# Patient Record
Sex: Female | Born: 1965 | Race: White | Hispanic: No | Marital: Married | Smoking: Never smoker
Health system: Southern US, Community
[De-identification: ages and names within clinical notes are randomized; demographics above are authoritative.]

## PROBLEM LIST (undated history)

## (undated) DIAGNOSIS — E785 Hyperlipidemia, unspecified: Secondary | ICD-10-CM

## (undated) DIAGNOSIS — K219 Gastro-esophageal reflux disease without esophagitis: Secondary | ICD-10-CM

## (undated) HISTORY — DX: Gastro-esophageal reflux disease without esophagitis: K21.9

## (undated) HISTORY — DX: Hyperlipidemia, unspecified: E78.5

## (undated) HISTORY — PX: OTHER SURGICAL HISTORY: SHX169

---

## 1998-05-14 ENCOUNTER — Other Ambulatory Visit: Admission: RE | Admit: 1998-05-14 | Discharge: 1998-05-14 | Payer: Self-pay | Admitting: Obstetrics and Gynecology

## 1999-06-13 ENCOUNTER — Other Ambulatory Visit: Admission: RE | Admit: 1999-06-13 | Discharge: 1999-06-13 | Payer: Self-pay | Admitting: Obstetrics and Gynecology

## 2000-06-22 ENCOUNTER — Encounter: Payer: Self-pay | Admitting: Obstetrics and Gynecology

## 2000-06-22 ENCOUNTER — Ambulatory Visit (HOSPITAL_COMMUNITY): Admission: RE | Admit: 2000-06-22 | Discharge: 2000-06-22 | Payer: Self-pay | Admitting: Obstetrics and Gynecology

## 2001-11-08 ENCOUNTER — Other Ambulatory Visit: Admission: RE | Admit: 2001-11-08 | Discharge: 2001-11-08 | Payer: Self-pay | Admitting: Obstetrics and Gynecology

## 2002-11-14 ENCOUNTER — Other Ambulatory Visit: Admission: RE | Admit: 2002-11-14 | Discharge: 2002-11-14 | Payer: Self-pay | Admitting: Obstetrics and Gynecology

## 2003-03-07 ENCOUNTER — Encounter: Payer: Self-pay | Admitting: Obstetrics and Gynecology

## 2003-03-07 ENCOUNTER — Ambulatory Visit (HOSPITAL_COMMUNITY): Admission: RE | Admit: 2003-03-07 | Discharge: 2003-03-07 | Payer: Self-pay | Admitting: Obstetrics and Gynecology

## 2003-12-21 ENCOUNTER — Other Ambulatory Visit: Admission: RE | Admit: 2003-12-21 | Discharge: 2003-12-21 | Payer: Self-pay | Admitting: Obstetrics and Gynecology

## 2004-07-12 HISTORY — PX: ESOPHAGOGASTRODUODENOSCOPY: SHX1529

## 2004-07-21 ENCOUNTER — Ambulatory Visit (HOSPITAL_COMMUNITY): Admission: RE | Admit: 2004-07-21 | Discharge: 2004-07-21 | Payer: Self-pay | Admitting: Gastroenterology

## 2004-09-11 ENCOUNTER — Encounter: Admission: RE | Admit: 2004-09-11 | Discharge: 2004-09-11 | Payer: Self-pay | Admitting: Gastroenterology

## 2005-02-04 ENCOUNTER — Other Ambulatory Visit: Admission: RE | Admit: 2005-02-04 | Discharge: 2005-02-04 | Payer: Self-pay | Admitting: Obstetrics and Gynecology

## 2006-06-25 ENCOUNTER — Ambulatory Visit: Payer: Self-pay | Admitting: Family Medicine

## 2006-10-20 ENCOUNTER — Ambulatory Visit: Payer: Self-pay | Admitting: Family Medicine

## 2007-05-11 ENCOUNTER — Ambulatory Visit: Payer: Self-pay | Admitting: Family Medicine

## 2008-08-14 ENCOUNTER — Ambulatory Visit: Payer: Self-pay | Admitting: Family Medicine

## 2009-05-31 ENCOUNTER — Ambulatory Visit: Payer: Self-pay | Admitting: Family Medicine

## 2009-11-21 ENCOUNTER — Ambulatory Visit: Payer: Self-pay | Admitting: Family Medicine

## 2010-11-11 ENCOUNTER — Ambulatory Visit: Admit: 2010-11-11 | Payer: Self-pay | Admitting: Family Medicine

## 2011-01-02 ENCOUNTER — Encounter: Payer: Self-pay | Admitting: Family Medicine

## 2011-01-20 ENCOUNTER — Encounter (INDEPENDENT_AMBULATORY_CARE_PROVIDER_SITE_OTHER): Payer: 59 | Admitting: Family Medicine

## 2011-01-20 DIAGNOSIS — K219 Gastro-esophageal reflux disease without esophagitis: Secondary | ICD-10-CM

## 2011-01-20 DIAGNOSIS — J309 Allergic rhinitis, unspecified: Secondary | ICD-10-CM

## 2011-01-20 DIAGNOSIS — Z0000 Encounter for general adult medical examination without abnormal findings: Secondary | ICD-10-CM

## 2011-01-20 DIAGNOSIS — E785 Hyperlipidemia, unspecified: Secondary | ICD-10-CM

## 2011-01-20 DIAGNOSIS — Z79899 Other long term (current) drug therapy: Secondary | ICD-10-CM

## 2011-02-27 NOTE — Op Note (Signed)
Lynn Fernandez, Lynn Fernandez                ACCOUNT NO.:  [id number]   MEDICAL RECORD NO.:  [id number]          PATIENT TYPE:  AMB   LOCATION:  ENDO                         FACILITY:  [facility]   PHYSICIAN:  John C. Madilyn Fireman, M.D.    DATE OF BIRTH:  December 18, 1965   DATE OF PROCEDURE:  07/21/2004  DATE OF DISCHARGE:                                 OPERATIVE REPORT   PROCEDURE PERFORMED:  Esophagogastroduodenoscopy.   ENDOSCOPIST:  Barrie Folk, M.D.   INDICATIONS FOR PROCEDURE:  Chronic gastroesophageal reflux symptoms with  some breakthrough symptoms despite proton pump inhibitor.  Procedure is to  assess for any unhealed esophagitis or Barrett's esophagus,  and to guide  therapy regarding advisability of possible antireflux surgery.   DESCRIPTION OF PROCEDURE:  The patient was placed in the left lateral  decubitus position and placed on the pulse monitor with continuous low-flow  oxygen delivered by nasal cannula.  She was sedated with 50 mcg of IV  fentanyl, 5 mg of IV Versed.  The Olympus video endoscope was advanced under  direct vision into the oropharynx and the esophagus.  The esophagus was  straight and of normal caliber with the squamocolumnar line at 38  centimeters.  There was no visible esophagitis, ring, stricture or other  abnormality of the gastroesophageal junction.  The stomach was entered and a  small amount of liquid secretions was suctioned from the fundus.  A  retroflex view of the cardia was unremarkable.  The fundus, body, antrum and  pylorus all appeared normal.  The duodenum was entered and both the bulb and  second portion were well inspected and appeared to be within normal limits.  The scope was then withdrawn and the patient was returned to the recovery  room in stable condition.  The patient tolerated the procedure well.  There  were no immediate complications.   IMPRESSION:  Normal endoscopy.   PLAN:  Will continue medical therapy and consider increasing her proton  pump  inhibitor to twice daily dosing.      JCH/MEDQ  D:  07/21/2004  T:  07/21/2004  Job:  [id number]   cc:   Sharlot Gowda, M.D.  [address]  [place], [place] [address]  Fax: [phone]

## 2011-12-11 LAB — HM MAMMOGRAPHY: HM Mammogram: NORMAL

## 2011-12-11 LAB — HM PAP SMEAR: HM Pap smear: NORMAL

## 2012-01-19 ENCOUNTER — Ambulatory Visit: Payer: 59 | Admitting: Medical

## 2012-01-19 ENCOUNTER — Encounter: Payer: Self-pay | Admitting: Internal Medicine

## 2012-02-17 ENCOUNTER — Telehealth: Payer: Self-pay | Admitting: Family Medicine

## 2012-02-17 NOTE — Telephone Encounter (Signed)
Fax req rcd for Lipitor 20 mg  #30  1 po qd     [facility]

## 2012-02-18 ENCOUNTER — Other Ambulatory Visit: Payer: Self-pay

## 2012-02-18 NOTE — Telephone Encounter (Signed)
Denied pt hasnt been here in 5years

## 2012-02-29 ENCOUNTER — Telehealth: Payer: Self-pay | Admitting: Family Medicine

## 2012-02-29 MED ORDER — ATORVASTATIN CALCIUM 20 MG PO TABS: 20.0000 mg | ORAL_TABLET | Freq: Every day | ORAL | Status: DC

## 2012-02-29 NOTE — Telephone Encounter (Signed)
PT HAS A CPE NEXT MONTH

## 2012-02-29 NOTE — Telephone Encounter (Signed)
This is Lynn Fernandez. She has had a legal name change. She has a cpe scheduled in a month but needs lipitor until then. Pt uses [facility] in [place].

## 2012-03-31 ENCOUNTER — Encounter: Payer: Self-pay | Admitting: Family Medicine

## 2012-03-31 ENCOUNTER — Ambulatory Visit (INDEPENDENT_AMBULATORY_CARE_PROVIDER_SITE_OTHER): Payer: 59 | Admitting: Family Medicine

## 2012-03-31 VITALS — BP 120/80 | HR 74 | Ht 63.0 in | Wt 133.0 lb

## 2012-03-31 DIAGNOSIS — E785 Hyperlipidemia, unspecified: Secondary | ICD-10-CM | POA: Insufficient documentation

## 2012-03-31 DIAGNOSIS — J302 Other seasonal allergic rhinitis: Secondary | ICD-10-CM

## 2012-03-31 DIAGNOSIS — Z79899 Other long term (current) drug therapy: Secondary | ICD-10-CM

## 2012-03-31 DIAGNOSIS — J309 Allergic rhinitis, unspecified: Secondary | ICD-10-CM

## 2012-03-31 DIAGNOSIS — Z0000 Encounter for general adult medical examination without abnormal findings: Secondary | ICD-10-CM

## 2012-03-31 DIAGNOSIS — K219 Gastro-esophageal reflux disease without esophagitis: Secondary | ICD-10-CM

## 2012-03-31 LAB — COMPREHENSIVE METABOLIC PANEL
AST: 23 U/L (ref 0–37)
Albumin: 4 g/dL (ref 3.5–5.2)
BUN: 11 mg/dL (ref 6–23)
CO2: 25 mEq/L (ref 19–32)
Calcium: 9.6 mg/dL (ref 8.4–10.5)
Chloride: 105 mEq/L (ref 96–112)
Glucose, Bld: 79 mg/dL (ref 70–99)
Potassium: 4.2 mEq/L (ref 3.5–5.3)

## 2012-03-31 LAB — LIPID PANEL
Cholesterol: 205 mg/dL — ABNORMAL HIGH (ref 0–200)
HDL: 84 mg/dL (ref >39)
LDL Cholesterol: 83 mg/dL (ref 0–99)
Total CHOL/HDL Ratio: 2.4 Ratio
Triglycerides: 190 mg/dL — ABNORMAL HIGH (ref <150)
VLDL: 38 mg/dL (ref 0–40)

## 2012-03-31 NOTE — Progress Notes (Signed)
  Subjective:    Patient ID: Lynn Fernandez, female    DOB: 03-26-1966, 46 y.o.   MRN: [id number]  HPI She is here for a complete examination. She is followed by her gynecologist. She has had mammogram Pap smear. She is up-to-date on her immunizations. She continues on Lipitor. She does have reflux but is having no difficulties with this. Her allergies are also under good control. She was recently married and this is going quite well. They were living together prior to this. Social and family history were reviewed.   Review of Systems  Constitutional: Negative.   HENT: Negative.   Eyes: Negative.   Respiratory: Negative.   Cardiovascular: Negative.   Gastrointestinal: Negative.   Genitourinary: Negative.   Musculoskeletal: Negative.   Neurological: Negative.   Hematological: Negative.   Psychiatric/Behavioral: Negative.        Objective:   Physical Exam BP 120/80  Pulse 74  Ht 5\' 3"  (1.6 m)  Wt 133 lb (60.328 kg)  BMI 23.56 kg/m2  SpO2 95%  General Appearance:    Alert, cooperative, no distress, appears stated age  Head:    Normocephalic, without obvious abnormality, atraumatic  Eyes:    PERRL, conjunctiva/corneas clear, EOM's intact, fundi    benign  Ears:    Normal TM's and external ear canals  Nose:   Nares normal, mucosa normal, no drainage or sinus   tenderness  Throat:   Lips, mucosa, and tongue normal; teeth and gums normal  Neck:   Supple, no lymphadenopathy;  thyroid:  no   enlargement/tenderness/nodules; no carotid   bruit or JVD  Back:    Spine nontender, no curvature, ROM normal, no CVA     tenderness  Lungs:     Clear to auscultation bilaterally without wheezes, rales or     ronchi; respirations unlabored  Chest Wall:    No tenderness or deformity   Heart:    Regular rate and rhythm, S1 and S2 normal, no murmur, rub   or gallop  Breast Exam:    Deferred to GYN  Abdomen:     Soft, non-tender, nondistended, normoactive bowel sounds,    no masses, no  hepatosplenomegaly  Genitalia:    Deferred to GYN     Extremities:   No clubbing, cyanosis or edema  Pulses:   2+ and symmetric all extremities  Skin:   Skin color, texture, turgor normal, no rashes or lesions  Lymph nodes:   Cervical, supraclavicular, and axillary nodes normal  Neurologic:   CNII-XII intact, normal strength, sensation and gait; reflexes 2+ and symmetric throughout          Psych:   Normal mood, affect, hygiene and grooming.          Assessment & Plan:   1. Routine general medical examination at a health care facility  Lipid panel, Comprehensive metabolic panel  2. GERD (gastroesophageal reflux disease)    3. Allergic rhinitis, seasonal    4. Hyperlipidemia LDL goal < 100  Lipid panel, Comprehensive metabolic panel  5. Encounter for long-term (current) use of other medications  Lipid panel, Comprehensive metabolic panel   I encouraged her to continue to take good care of herself.

## 2012-04-01 ENCOUNTER — Telehealth: Payer: Self-pay | Admitting: Internal Medicine

## 2012-04-01 MED ORDER — ATORVASTATIN CALCIUM 20 MG PO TABS: 20.0000 mg | ORAL_TABLET | Freq: Every day | ORAL | Status: DC

## 2012-04-01 NOTE — Telephone Encounter (Signed)
done

## 2012-06-25 ENCOUNTER — Other Ambulatory Visit: Payer: Self-pay | Admitting: Family Medicine

## 2012-08-22 ENCOUNTER — Other Ambulatory Visit: Payer: Self-pay | Admitting: Family Medicine

## 2012-12-01 ENCOUNTER — Other Ambulatory Visit: Payer: Self-pay | Admitting: Family Medicine

## 2013-05-01 ENCOUNTER — Other Ambulatory Visit: Payer: Self-pay | Admitting: Family Medicine

## 2013-07-04 ENCOUNTER — Other Ambulatory Visit: Payer: Self-pay | Admitting: Family Medicine

## 2015-01-02 ENCOUNTER — Encounter: Payer: Self-pay | Admitting: Family Medicine

## 2015-01-10 ENCOUNTER — Ambulatory Visit (INDEPENDENT_AMBULATORY_CARE_PROVIDER_SITE_OTHER): Payer: BLUE CROSS/BLUE SHIELD | Admitting: Family Medicine

## 2015-01-10 ENCOUNTER — Encounter: Payer: Self-pay | Admitting: Family Medicine

## 2015-01-10 VITALS — BP 120/78 | HR 64 | Ht 63.5 in | Wt 132.0 lb

## 2015-01-10 DIAGNOSIS — E785 Hyperlipidemia, unspecified: Secondary | ICD-10-CM | POA: Diagnosis not present

## 2015-01-10 DIAGNOSIS — K219 Gastro-esophageal reflux disease without esophagitis: Secondary | ICD-10-CM

## 2015-01-10 DIAGNOSIS — J302 Other seasonal allergic rhinitis: Secondary | ICD-10-CM | POA: Diagnosis not present

## 2015-01-10 DIAGNOSIS — Z0000 Encounter for general adult medical examination without abnormal findings: Secondary | ICD-10-CM | POA: Diagnosis not present

## 2015-01-10 LAB — CBC WITH DIFFERENTIAL/PLATELET
BASOS ABS: 0.1 10*3/uL (ref 0.0–0.1)
BASOS PCT: 1 % (ref 0–1)
EOS ABS: 0.2 10*3/uL (ref 0.0–0.7)
Eosinophils Relative: 3 % (ref 0–5)
HEMATOCRIT: 39.5 % (ref 36.0–46.0)
Hemoglobin: 13.4 g/dL (ref 12.0–15.0)
Lymphocytes Relative: 36 % (ref 12–46)
Lymphs Abs: 2.4 10*3/uL (ref 0.7–4.0)
MCH: 30.5 pg (ref 26.0–34.0)
MCHC: 33.9 g/dL (ref 30.0–36.0)
MCV: 90 fL (ref 78.0–100.0)
MONO ABS: 0.5 10*3/uL (ref 0.1–1.0)
MONOS PCT: 8 % (ref 3–12)
MPV: 10.4 fL (ref 8.6–12.4)
Neutro Abs: 3.5 10*3/uL (ref 1.7–7.7)
Neutrophils Relative %: 52 % (ref 43–77)
PLATELETS: 302 10*3/uL (ref 150–400)
RBC: 4.39 MIL/uL (ref 3.87–5.11)
RDW: 12.4 % (ref 11.5–15.5)
WBC: 6.8 10*3/uL (ref 4.0–10.5)

## 2015-01-10 LAB — COMPREHENSIVE METABOLIC PANEL
ALT: 23 U/L (ref 0–35)
AST: 21 U/L (ref 0–37)
Albumin: 4.4 g/dL (ref 3.5–5.2)
Alkaline Phosphatase: 15 U/L — ABNORMAL LOW (ref 39–117)
BUN: 12 mg/dL (ref 6–23)
CALCIUM: 9.5 mg/dL (ref 8.4–10.5)
CHLORIDE: 100 meq/L (ref 96–112)
CO2: 24 mEq/L (ref 19–32)
Creat: 0.71 mg/dL (ref 0.50–1.10)
GLUCOSE: 78 mg/dL (ref 70–99)
POTASSIUM: 3.8 meq/L (ref 3.5–5.3)
SODIUM: 135 meq/L (ref 135–145)
TOTAL PROTEIN: 7.2 g/dL (ref 6.0–8.3)
Total Bilirubin: 0.9 mg/dL (ref 0.2–1.2)

## 2015-01-10 LAB — LIPID PANEL
CHOL/HDL RATIO: 3.2 ratio
CHOLESTEROL: 266 mg/dL — AB (ref 0–200)
HDL: 84 mg/dL (ref >=46)
LDL CALC: 152 mg/dL — AB (ref 0–99)
Triglycerides: 152 mg/dL — ABNORMAL HIGH (ref <150)
VLDL: 30 mg/dL (ref 0–40)

## 2015-01-10 NOTE — Progress Notes (Signed)
Subjective:    Patient ID: Lynn Fernandez, female    DOB: 1966-07-25, 49 y.o.   MRN: [id number]  HPI She is here for an annual exam. She complains of an intermittent 3-4 month history of brief episodes of elongated sighs or pauses in respiration follow by a deeper, longer inspiration. These episodes occur once or twice every other day but in the past week it has been occuring less frequently. She reports a fleeting tightness in her chest that occurs along with the abnormal respiration. Denies chest pain, palpitations, cough, DOE, PND. She also denies fever, chills, unintentional weight loss, fatigue, changes in bowel or bladder habits.   She states she has seasonal allergies and takes Zyrtec as needed. Her allergies are usually worse in the spring time.  GERD is well managed by avoiding offensive foods and taking Nexium daily. She has a history of hyperlipidemia and states she chose to stop taking Lipitor in 2013 and has been eating a healthy diet. She does not exercise but is active by taking care of her animals and riding her horse.   She is married and has a good home life. Reports stressful work life but manages by talking with co-workers and friends.  She gets pap smears and pelvic exams at her Gynecologist office and mammograms as recommended. She is currently sexually active, having regular menstrual cycles and uses an OCP.   Regular eye exams, dental exams, wears her seatbelt and has smoke detectors in her home.  Allergies, Medications, family and social history reviewed. Health maintenance and immunizations also reviewed.   Review of Systems  All other systems reviewed and are negative.      Objective:   Physical Exam  BP 120/78 mmHg  Pulse 64  Ht 5' 3.5" (1.613 m)  Wt 132 lb (59.875 kg)  BMI 23.01 kg/m2  General Appearance:    Alert, cooperative, no distress, appears stated age  Head:    Normocephalic, without obvious abnormality, atraumatic  Eyes:    PERRL,  conjunctiva/corneas clear, EOM's intact, fundi    benign, both eyes  Ears:    Normal TM's and external ear canals, both ears  Nose:   Nares slightly erythematous, septum midline, mucosa normal, no drainage    or sinus tenderness  Throat:   Lips, mucosa, and tongue normal; teeth and gums normal  Neck:   Supple, symmetrical, trachea midline, no adenopathy;    thyroid:  no enlargement/tenderness/nodules  Back:     Symmetric, no curvature, ROM normal, no CVA tenderness  Lungs:     Clear to auscultation bilaterally, respirations unlabored  Chest Wall:    No tenderness or deformity   Heart:    Regular rate and rhythm, S1 and S2 normal, no murmur, rub   or gallop  Breast Exam:    Deferred to OB/GYN  Abdomen:     Soft, non-tender, bowel sounds active all four quadrants,    no masses, no organomegaly  Genitalia:    Deferred to OB/GYN  Rectal:    Deferred   Extremities:   Extremities normal, atraumatic, no cyanosis or edema  Pulses:   2+ and symmetric all extremities  Skin:   Skin color, texture, turgor normal, no rashes or lesions  Lymph nodes:   Cervical, supraclavicular, and axillary nodes normal  Neurologic:   CNII-XII intact, normal strength, sensation and reflexes    throughout         Assessment & Plan:  Routine general medical examination at a health care  facility  Allergic rhinitis, seasonal  Gastroesophageal reflux disease without esophagitis  Hyperlipidemia with target LDL less than 100  Discussed that her intermittent irregular respirations are non diagnostic and that it does not appear to be cardiac related. She will let me know if this worsens or becomes more frequent. Discussed her continuing to take good care of herself. Follow up pending lab results.

## 2016-01-22 DIAGNOSIS — Z6823 Body mass index (BMI) 23.0-23.9, adult: Secondary | ICD-10-CM | POA: Diagnosis not present

## 2016-01-22 DIAGNOSIS — Z124 Encounter for screening for malignant neoplasm of cervix: Secondary | ICD-10-CM | POA: Diagnosis not present

## 2016-01-22 DIAGNOSIS — Z1231 Encounter for screening mammogram for malignant neoplasm of breast: Secondary | ICD-10-CM | POA: Diagnosis not present

## 2016-01-22 DIAGNOSIS — Z01419 Encounter for gynecological examination (general) (routine) without abnormal findings: Secondary | ICD-10-CM | POA: Diagnosis not present

## 2016-01-27 ENCOUNTER — Other Ambulatory Visit: Payer: Self-pay | Admitting: Obstetrics and Gynecology

## 2016-01-27 DIAGNOSIS — R928 Other abnormal and inconclusive findings on diagnostic imaging of breast: Secondary | ICD-10-CM

## 2016-03-06 ENCOUNTER — Ambulatory Visit
Admission: RE | Admit: 2016-03-06 | Discharge: 2016-03-06 | Disposition: A | Discharge status: Home / Self Care | Payer: BLUE CROSS/BLUE SHIELD | Source: Ambulatory Visit | Attending: Obstetrics and Gynecology | Admitting: Obstetrics and Gynecology

## 2016-03-06 DIAGNOSIS — R928 Other abnormal and inconclusive findings on diagnostic imaging of breast: Secondary | ICD-10-CM

## 2016-06-09 DIAGNOSIS — H43811 Vitreous degeneration, right eye: Secondary | ICD-10-CM | POA: Diagnosis not present

## 2016-06-09 DIAGNOSIS — H4311 Vitreous hemorrhage, right eye: Secondary | ICD-10-CM | POA: Diagnosis not present

## 2016-06-09 DIAGNOSIS — H33311 Horseshoe tear of retina without detachment, right eye: Secondary | ICD-10-CM | POA: Diagnosis not present

## 2016-06-19 DIAGNOSIS — H33311 Horseshoe tear of retina without detachment, right eye: Secondary | ICD-10-CM | POA: Diagnosis not present

## 2016-11-05 ENCOUNTER — Ambulatory Visit (INDEPENDENT_AMBULATORY_CARE_PROVIDER_SITE_OTHER): Payer: BLUE CROSS/BLUE SHIELD | Admitting: Family Medicine

## 2016-11-05 ENCOUNTER — Encounter: Payer: Self-pay | Admitting: Family Medicine

## 2016-11-05 ENCOUNTER — Ambulatory Visit (INDEPENDENT_AMBULATORY_CARE_PROVIDER_SITE_OTHER): Payer: BLUE CROSS/BLUE SHIELD

## 2016-11-05 VITALS — BP 135/89 | HR 93 | Temp 98.6°F | Ht 63.5 in | Wt 137.4 lb

## 2016-11-05 DIAGNOSIS — F411 Generalized anxiety disorder: Secondary | ICD-10-CM | POA: Diagnosis not present

## 2016-11-05 DIAGNOSIS — K2100 Gastro-esophageal reflux disease with esophagitis, without bleeding: Secondary | ICD-10-CM

## 2016-11-05 DIAGNOSIS — E782 Mixed hyperlipidemia: Secondary | ICD-10-CM

## 2016-11-05 DIAGNOSIS — N951 Menopausal and female climacteric states: Secondary | ICD-10-CM

## 2016-11-05 DIAGNOSIS — M25571 Pain in right ankle and joints of right foot: Secondary | ICD-10-CM | POA: Diagnosis not present

## 2016-11-05 MED ORDER — PANTOPRAZOLE SODIUM 40 MG PO TBEC: 40.0000 mg | DELAYED_RELEASE_TABLET | Freq: Every day | ORAL | 11 refills | Status: DC

## 2016-11-05 MED ORDER — DULOXETINE HCL 30 MG PO CPEP: 30.0000 mg | ORAL_CAPSULE | Freq: Every day | ORAL | 0 refills | Status: DC

## 2016-11-05 NOTE — Progress Notes (Signed)
Subjective:  Patient ID: Lynn Fernandez, female    DOB: 01-Mar-1966  Age: 51 y.o. MRN: [id number]  CC: New Patient (Initial Visit) (pt here today c/o joint pain, right ankle and leg pain/swelling, nausea)   HPI Lynn Fernandez presents for multiple sx noted above. NKI right ankle. No bruising. Onset 3 days ago. Moderate pain. Able to ambulate. Hx of anxiety & depression. Elevated stress at work & with family recently. Causing her to feel anxious. Taking multiple vitamiuns & supplements including black cohosh for menopause. Feeling hot flashes frequently.Menses irreg, less frequent Depression screen [facility] 2/9 11/05/2016  Decreased Interest 0  Down, Depressed, Hopeless 0  PHQ - 2 Score 0     History Lynn Fernandez has a past medical history of Allergic rhinitis; Dyslipidemia; GERD (gastroesophageal reflux disease); and Hyperlipidemia.   She has a past surgical history that includes pyloric stenosis and Esophagogastroduodenoscopy (10/05).   Her family history includes Alcohol abuse in her father; Cancer in her father and paternal grandmother; Diabetes in her paternal uncle; Heart disease in her father and maternal grandfather; Hyperlipidemia in her father.She reports that she has never smoked. She has never used smokeless tobacco. She reports that she drinks about 1.8 oz of alcohol per week . She reports that she does not use drugs.  Current Outpatient Prescriptions on File Prior to Visit  Medication Sig Dispense Refill  . drospirenone-ethinyl estradiol (YAZ,GIANVI,LORYNA) 3-0.02 MG tablet Take 1 tablet by mouth daily.     No current facility-administered medications on file prior to visit.     ROS Review of Systems  Constitutional: Negative for activity change, appetite change and fever.  HENT: Negative for congestion, rhinorrhea and sore throat.   Eyes: Negative for visual disturbance.  Respiratory: Negative for cough and shortness of breath.   Cardiovascular: Negative for chest pain  and palpitations.  Gastrointestinal: Negative for abdominal pain, diarrhea and nausea.  Genitourinary: Negative for dysuria.  Musculoskeletal: Negative for arthralgias and myalgias.    Objective:  BP 135/89   Pulse 93   Temp 98.6 F (37 C) (Oral)   Ht 5' 3.5" (1.613 m)   Wt 137 lb 6 oz (62.3 kg)   BMI 23.95 kg/m   Physical Exam  Constitutional: She is oriented to person, place, and time. She appears well-developed and well-nourished. No distress.  HENT:  Head: Normocephalic and atraumatic.  Right Ear: External ear normal.  Left Ear: External ear normal.  Nose: Nose normal.  Mouth/Throat: Oropharynx is clear and moist.  Eyes: Conjunctivae and EOM are normal. Pupils are equal, round, and reactive to light.  Neck: Normal range of motion. Neck supple. No thyromegaly present.  Cardiovascular: Normal rate, regular rhythm and normal heart sounds.   No murmur heard. Pulmonary/Chest: Effort normal and breath sounds normal. No respiratory distress. She has no wheezes. She has no rales.  Abdominal: Soft. Bowel sounds are normal. She exhibits no distension. There is no tenderness.  Musculoskeletal: Normal range of motion. She exhibits edema (trace.) and tenderness (for inversion, at talofibular ligament).  Lymphadenopathy:    She has no cervical adenopathy.  Neurological: She is alert and oriented to person, place, and time. She has normal reflexes.  Skin: Skin is warm and dry.  Psychiatric: Her behavior is normal. Judgment and thought content normal. Her mood appears anxious. Her speech is rapid and/or pressured. Cognition and memory are normal.    Assessment & Plan:   Bijal was seen today for new patient (initial visit).  Diagnoses and all  orders for this visit:  Pain in joint involving right ankle and foot -     DG Ankle Complete Right; Future -     CMP14+EGFR -     TSH  Mixed hyperlipidemia -     CMP14+EGFR -     Lipid panel -     TSH  Gastroesophageal reflux disease  with esophagitis -     CBC with Differential/Platelet -     CMP14+EGFR -     TSH -     pantoprazole (PROTONIX) 40 MG tablet; Take 1 tablet (40 mg total) by mouth daily. For stomach  Peri-menopause -     TSH  Generalized anxiety disorder  Other orders -     DULoxetine (CYMBALTA) 30 MG capsule; Take 1 capsule (30 mg total) by mouth daily. For one week then two daily. Take with a full stomach at suppertime   I have discontinued Ms. Dowe's Esomeprazole Magnesium (NEXIUM PO) and Cetirizine-Pseudoephedrine (ZYRTEC-D PO). I am also having her start on DULoxetine and pantoprazole. Additionally, I am having her maintain her drospirenone-ethinyl estradiol, loratadine-pseudoephedrine, b complex vitamins, Lactobacillus (PROBIOTIC ACIDOPHILUS PO), ASCORBIC ACID PO, co-enzyme Q-10, Vitamin D3, Acetaminophen-Caff-Pyrilamine (MIDOL COMPLETE PO), BLACK COHOSH EXTRACT PO, Magnesium, and Multiple Vitamins-Minerals (MULTIVITAMIN ADULT PO).  Meds ordered this encounter  Medications  . loratadine-pseudoephedrine (CLARITIN-D 24-HOUR) 10-240 MG 24 hr tablet    Sig: Take 1 tablet by mouth daily.  Marland Kitchen b complex vitamins tablet    Sig: Take 1 tablet by mouth daily.  . Lactobacillus (PROBIOTIC ACIDOPHILUS PO)    Sig: Take by mouth.  . ASCORBIC ACID PO    Sig: Take by mouth.  . co-enzyme Q-10 50 MG capsule    Sig: Take 50 mg by mouth daily.  . Cholecalciferol (VITAMIN D3) 1000 units CAPS    Sig: Take by mouth.  . Acetaminophen-Caff-Pyrilamine (MIDOL COMPLETE PO)    Sig: Take by mouth.  Marland Kitchen BLACK COHOSH EXTRACT PO    Sig: Take by mouth.  . Magnesium 250 MG TABS    Sig: Take by mouth.  . Multiple Vitamins-Minerals (MULTIVITAMIN ADULT PO)    Sig: Take by mouth.  . DULoxetine (CYMBALTA) 30 MG capsule    Sig: Take 1 capsule (30 mg total) by mouth daily. For one week then two daily. Take with a full stomach at suppertime    Dispense:  60 capsule    Refill:  0  . pantoprazole (PROTONIX) 40 MG tablet     Sig: Take 1 tablet (40 mg total) by mouth daily. For stomach    Dispense:  30 tablet    Refill:  11     Follow-up: Return in about 1 month (around 12/06/2016) for Wellness.  Claretta Fraise, M.D.

## 2016-11-05 NOTE — Patient Instructions (Signed)
Wear ankle brace for all ambulation for 2 weeks. After that ewan by putting on 2 hours later in the day than the previous day. Back up to previous if pain worsens.

## 2016-11-06 LAB — CMP14+EGFR
ALBUMIN: 4.6 g/dL (ref 3.5–5.5)
ALK PHOS: 16 IU/L — AB (ref 39–117)
ALT: 23 IU/L (ref 0–32)
AST: 24 IU/L (ref 0–40)
Albumin/Globulin Ratio: 1.7 (ref 1.2–2.2)
BUN / CREAT RATIO: 11 (ref 9–23)
BUN: 9 mg/dL (ref 6–24)
Bilirubin Total: 0.7 mg/dL (ref 0.0–1.2)
CO2: 23 mmol/L (ref 18–29)
CREATININE: 0.8 mg/dL (ref 0.57–1.00)
Calcium: 9.7 mg/dL (ref 8.7–10.2)
Chloride: 96 mmol/L (ref 96–106)
GFR calc non Af Amer: 87 mL/min/{1.73_m2} (ref >59)
GFR, EST AFRICAN AMERICAN: 100 mL/min/{1.73_m2} (ref >59)
GLUCOSE: 117 mg/dL — AB (ref 65–99)
Globulin, Total: 2.7 g/dL (ref 1.5–4.5)
Potassium: 3.9 mmol/L (ref 3.5–5.2)
Sodium: 139 mmol/L (ref 134–144)
TOTAL PROTEIN: 7.3 g/dL (ref 6.0–8.5)

## 2016-11-06 LAB — CBC WITH DIFFERENTIAL/PLATELET
BASOS: 0 %
Basophils Absolute: 0 10*3/uL (ref 0.0–0.2)
EOS (ABSOLUTE): 0.1 10*3/uL (ref 0.0–0.4)
EOS: 1 %
HEMATOCRIT: 39.7 % (ref 34.0–46.6)
HEMOGLOBIN: 13.2 g/dL (ref 11.1–15.9)
Immature Grans (Abs): 0 10*3/uL (ref 0.0–0.1)
Immature Granulocytes: 0 %
LYMPHS ABS: 2.1 10*3/uL (ref 0.7–3.1)
Lymphs: 37 %
MCH: 30.9 pg (ref 26.6–33.0)
MCHC: 33.2 g/dL (ref 31.5–35.7)
MCV: 93 fL (ref 79–97)
MONOCYTES: 9 %
MONOS ABS: 0.5 10*3/uL (ref 0.1–0.9)
NEUTROS ABS: 3 10*3/uL (ref 1.4–7.0)
Neutrophils: 53 %
Platelets: 322 10*3/uL (ref 150–379)
RBC: 4.27 x10E6/uL (ref 3.77–5.28)
RDW: 12.8 % (ref 12.3–15.4)
WBC: 5.7 10*3/uL (ref 3.4–10.8)

## 2016-11-06 LAB — LIPID PANEL
Chol/HDL Ratio: 2.7 ratio units (ref 0.0–4.4)
Cholesterol, Total: 247 mg/dL — ABNORMAL HIGH (ref 100–199)
HDL: 90 mg/dL (ref >39)
LDL CALC: 121 mg/dL — AB (ref 0–99)
Triglycerides: 181 mg/dL — ABNORMAL HIGH (ref 0–149)
VLDL CHOLESTEROL CAL: 36 mg/dL (ref 5–40)

## 2016-11-06 LAB — TSH: TSH: 2.43 u[IU]/mL (ref 0.450–4.500)

## 2016-11-12 ENCOUNTER — Telehealth: Payer: Self-pay | Admitting: Family Medicine

## 2016-11-12 NOTE — Telephone Encounter (Signed)
Patient states that the cymblata is causing her to be nauseated, unable to focus and exhausted. Patient states that she had to leave work today due to be exhausted.

## 2016-11-13 NOTE — Telephone Encounter (Signed)
Patient returned phone call stating that she is taking one capsule nightly but is not taking with food.  Informed patient to eat a meal with to see if that will help with symptoms

## 2016-11-13 NOTE — Telephone Encounter (Signed)
Please contact the patient: Is she taking it with a protein rich meal? Taking at night? One or two capsules? WS

## 2016-11-17 DIAGNOSIS — M25571 Pain in right ankle and joints of right foot: Secondary | ICD-10-CM | POA: Diagnosis not present

## 2016-12-02 ENCOUNTER — Other Ambulatory Visit: Payer: Self-pay | Admitting: *Deleted

## 2016-12-02 ENCOUNTER — Ambulatory Visit (INDEPENDENT_AMBULATORY_CARE_PROVIDER_SITE_OTHER): Payer: BLUE CROSS/BLUE SHIELD | Admitting: Family Medicine

## 2016-12-02 VITALS — BP 136/72 | HR 78 | Temp 98.6°F | Ht 63.5 in | Wt 139.0 lb

## 2016-12-02 DIAGNOSIS — K2100 Gastro-esophageal reflux disease with esophagitis, without bleeding: Secondary | ICD-10-CM

## 2016-12-02 DIAGNOSIS — Z0000 Encounter for general adult medical examination without abnormal findings: Secondary | ICD-10-CM | POA: Diagnosis not present

## 2016-12-02 DIAGNOSIS — E782 Mixed hyperlipidemia: Secondary | ICD-10-CM | POA: Diagnosis not present

## 2016-12-02 LAB — CBC WITH DIFFERENTIAL/PLATELET
BASOS: 0 %
Basophils Absolute: 0 10*3/uL (ref 0.0–0.2)
EOS (ABSOLUTE): 0.2 10*3/uL (ref 0.0–0.4)
Eos: 3 %
HEMOGLOBIN: 13.6 g/dL (ref 11.1–15.9)
Hematocrit: 40.8 % (ref 34.0–46.6)
IMMATURE GRANS (ABS): 0 10*3/uL (ref 0.0–0.1)
Immature Granulocytes: 0 %
LYMPHS ABS: 2.2 10*3/uL (ref 0.7–3.1)
LYMPHS: 34 %
MCH: 31.1 pg (ref 26.6–33.0)
MCHC: 33.3 g/dL (ref 31.5–35.7)
MCV: 93 fL (ref 79–97)
Monocytes Absolute: 0.5 10*3/uL (ref 0.1–0.9)
Monocytes: 8 %
NEUTROS ABS: 3.7 10*3/uL (ref 1.4–7.0)
Neutrophils: 55 %
Platelets: 312 10*3/uL (ref 150–379)
RBC: 4.38 x10E6/uL (ref 3.77–5.28)
RDW: 12.5 % (ref 12.3–15.4)
WBC: 6.7 10*3/uL (ref 3.4–10.8)

## 2016-12-02 LAB — CMP14+EGFR
ALBUMIN: 4.4 g/dL (ref 3.5–5.5)
ALT: 23 IU/L (ref 0–32)
AST: 23 IU/L (ref 0–40)
Albumin/Globulin Ratio: 1.6 (ref 1.2–2.2)
Alkaline Phosphatase: 17 IU/L — ABNORMAL LOW (ref 39–117)
BILIRUBIN TOTAL: 0.8 mg/dL (ref 0.0–1.2)
BUN / CREAT RATIO: 13 (ref 9–23)
BUN: 11 mg/dL (ref 6–24)
CALCIUM: 10 mg/dL (ref 8.7–10.2)
CHLORIDE: 99 mmol/L (ref 96–106)
CO2: 21 mmol/L (ref 18–29)
Creatinine, Ser: 0.83 mg/dL (ref 0.57–1.00)
GFR, EST AFRICAN AMERICAN: 96 (ref >59)
GFR, EST NON AFRICAN AMERICAN: 83 (ref >59)
Globulin, Total: 2.8 (ref 1.5–4.5)
Glucose: 85 mg/dL (ref 65–99)
POTASSIUM: 4.9 mmol/L (ref 3.5–5.2)
Sodium: 137 mmol/L (ref 134–144)
Total Protein: 7.2 g/dL (ref 6.0–8.5)

## 2016-12-02 MED ORDER — PANTOPRAZOLE SODIUM 40 MG PO TBEC: 40.0000 mg | DELAYED_RELEASE_TABLET | Freq: Every day | ORAL | 3 refills | Status: DC

## 2016-12-02 MED ORDER — VORTIOXETINE HBR 5 MG PO TABS: 5.0000 mg | ORAL_TABLET | Freq: Every day | ORAL | 2 refills | Status: DC

## 2016-12-02 NOTE — Progress Notes (Signed)
Subjective:  Patient ID: Lynn Fernandez, female    DOB: 1966/08/11  Age: 51 y.o. MRN: [id number]  CC: Annual Exam (pt here today for Wellness exam and still having right ankle pain)   HPI Lynn Fernandez presents for wellness exam. Tried cymbalta. Couldn't tolerate due to nausea & drowsiness. Planning GYN exam with specialist next month. Ankle pain better. Not well. Brace is uncomfortable.   History Iysis has a past medical history of Allergic rhinitis; Dyslipidemia; GERD (gastroesophageal reflux disease); and Hyperlipidemia.   She has a past surgical history that includes pyloric stenosis and Esophagogastroduodenoscopy (10/05).   Her family history includes Alcohol abuse in her father; Cancer in her father and paternal grandmother; Diabetes in her paternal uncle; Heart disease in her father and maternal grandfather; Hyperlipidemia in her father.She reports that she has never smoked. She has never used smokeless tobacco. She reports that she drinks about 1.8 oz of alcohol per week . She reports that she does not use drugs.    ROS Review of Systems  Constitutional: Negative for appetite change, chills, diaphoresis, fatigue, fever and unexpected weight change.  HENT: Negative for congestion, ear pain, hearing loss, postnasal drip, rhinorrhea, sneezing, sore throat and trouble swallowing.   Eyes: Negative for pain.  Respiratory: Negative for cough, chest tightness and shortness of breath.   Cardiovascular: Negative for chest pain and palpitations.  Gastrointestinal: Negative for abdominal pain, constipation, diarrhea, nausea and vomiting.  Endocrine: Negative for cold intolerance, heat intolerance, polydipsia, polyphagia and polyuria.  Genitourinary: Negative for dysuria, frequency and menstrual problem.  Musculoskeletal: Negative for arthralgias and joint swelling.  Skin: Negative for rash.  Allergic/Immunologic: Negative for environmental allergies.  Neurological: Negative  for dizziness, weakness, numbness and headaches.  Psychiatric/Behavioral: Negative for agitation and dysphoric mood.    Objective:  BP 136/72   Pulse 78   Temp 98.6 F (37 C) (Oral)   Ht 5' 3.5" (1.613 m)   Wt 139 lb (63 kg)   BMI 24.24 kg/m   BP Readings from Last 3 Encounters:  12/02/16 136/72  11/05/16 135/89  01/10/15 120/78    Wt Readings from Last 3 Encounters:  12/02/16 139 lb (63 kg)  11/05/16 137 lb 6 oz (62.3 kg)  01/10/15 132 lb (59.9 kg)     Physical Exam  Constitutional: She is oriented to person, place, and time. She appears well-developed and well-nourished. No distress.  HENT:  Head: Normocephalic and atraumatic.  Right Ear: External ear normal.  Left Ear: External ear normal.  Nose: Nose normal.  Mouth/Throat: Oropharynx is clear and moist.  Eyes: Conjunctivae and EOM are normal. Pupils are equal, round, and reactive to light.  Neck: Normal range of motion. Neck supple. No thyromegaly present.  Cardiovascular: Normal rate, regular rhythm and normal heart sounds.   No murmur heard. Pulmonary/Chest: Effort normal and breath sounds normal. No respiratory distress. She has no wheezes. She has no rales.  Abdominal: Soft. Bowel sounds are normal. She exhibits no distension. There is no tenderness.  Musculoskeletal: Normal range of motion.  Lymphadenopathy:    She has no cervical adenopathy.  Neurological: She is alert and oriented to person, place, and time. She has normal reflexes.  Skin: Skin is warm and dry.  Psychiatric: She has a normal mood and affect. Her behavior is normal. Judgment and thought content normal.    Mm Diag Breast Tomo Uni Right  Result Date: 03/06/2016 CLINICAL DATA:  Recall from screening mammography, possible mass or focal asymmetry in  the upper right breast, posterior depth, visualized only on the MLO view. EXAM: 2D DIGITAL DIAGNOSTIC UNILATERAL RIGHT MAMMOGRAM WITH CAD AND ADJUNCT TOMO COMPARISON:  01/22/2016, 12/27/2014 and  earlier. ACR Breast Density Category b: There are scattered areas of fibroglandular density. FINDINGS: 2D and tomosynthesis spot compression view of the area of concern in the upper right breast and a 2D and tomosynthesis full field mediolateral view of the right breast were obtained. No persistent mass, architectural distortion or suspicious calcification in the area of concern on the screening mammogram. The full field mediolateral image was processed with CAD. IMPRESSION: No mammographic evidence of malignancy, right breast. The area of concern on the screening mammogram is consistent with an island of fibroglandular tissue. RECOMMENDATION: Screening mammogram in one year.(Code:SM-B-01Y) I have discussed the findings and recommendations with the patient. Results were also provided in writing at the conclusion of the visit. If applicable, a reminder letter will be sent to the patient regarding the next appointment. BI-RADS CATEGORY  1: Negative. Electronically Signed   By: Evangeline Dakin M.D.   On: 03/06/2016 10:51    Assessment & Plan:   [name] was seen today for annual exam.  Diagnoses and all orders for this visit:  Well adult exam -     NMR, lipoprofile -     CMP14+EGFR -     CBC with Differential/Platelet  Mixed hyperlipidemia -     NMR, lipoprofile  Other orders -     vortioxetine HBr (TRINTELLIX) 5 MG TABS; Take 1 tablet (5 mg total) by mouth at bedtime.    I have discontinued Ms. Linton-Hall's co-enzyme Q-10 and DULoxetine. I am also having her start on vortioxetine HBr. Additionally, I am having her maintain her drospirenone-ethinyl estradiol, loratadine-pseudoephedrine, b complex vitamins, Lactobacillus (PROBIOTIC ACIDOPHILUS PO), ASCORBIC ACID PO, Vitamin D3, Acetaminophen-Caff-Pyrilamine (MIDOL COMPLETE PO), BLACK COHOSH EXTRACT PO, Magnesium, Multiple Vitamins-Minerals (MULTIVITAMIN ADULT PO), and pantoprazole.  Allergies as of 12/02/2016      Reactions   Penicillins         Medication List       Accurate as of 12/02/16 11:59 PM. Always use your most recent med list.          ASCORBIC ACID PO Take by mouth.   b complex vitamins tablet Take 1 tablet by mouth daily.   BLACK COHOSH EXTRACT PO Take by mouth.   drospirenone-ethinyl estradiol 3-0.02 MG tablet Commonly known as:  YAZ,GIANVI,LORYNA Take 1 tablet by mouth daily.   loratadine-pseudoephedrine 10-240 MG 24 hr tablet Commonly known as:  CLARITIN-D 24-hour Take 1 tablet by mouth daily.   Magnesium 250 MG Tabs Take by mouth.   MIDOL COMPLETE PO Take by mouth.   MULTIVITAMIN ADULT PO Take by mouth.   pantoprazole 40 MG tablet Commonly known as:  PROTONIX Take 1 tablet (40 mg total) by mouth daily. For stomach   PROBIOTIC ACIDOPHILUS PO Take by mouth.   Vitamin D3 1000 units Caps Take by mouth.   vortioxetine HBr 5 MG Tabs Commonly known as:  TRINTELLIX Take 1 tablet (5 mg total) by mouth at bedtime.        Follow-up: Return in about 3 months (around 03/01/2017).  Claretta Fraise, M.D.

## 2016-12-02 NOTE — Telephone Encounter (Signed)
[facility]. I called patient and verified with patient that [facility] and patient did. Rx sent to pharmacy.

## 2016-12-03 LAB — NMR, LIPOPROFILE
CHOLESTEROL: 251 mg/dL — AB (ref 100–199)
HDL Cholesterol by NMR: 99 mg/dL (ref >39)
HDL PARTICLE NUMBER: 57.2 umol/L (ref >=30.5)
LDL PARTICLE NUMBER: 1591 nmol/L — AB (ref <1000)
LDL SIZE: 21.4 nm (ref >20.5)
LDL-C: 125 — ABNORMAL HIGH (ref 0–99)
LP-IR SCORE: 33 (ref <=45)
TRIGLYCERIDES BY NMR: 136 mg/dL (ref 0–149)

## 2016-12-06 ENCOUNTER — Encounter: Payer: Self-pay | Admitting: Family Medicine

## 2016-12-08 ENCOUNTER — Other Ambulatory Visit: Payer: Self-pay | Admitting: Family Medicine

## 2016-12-08 ENCOUNTER — Telehealth: Payer: Self-pay | Admitting: *Deleted

## 2016-12-08 MED ORDER — DESVENLAFAXINE SUCCINATE ER 50 MG PO TB24: 50.0000 mg | ORAL_TABLET | Freq: Every day | ORAL | 1 refills | Status: DC

## 2016-12-08 NOTE — Telephone Encounter (Signed)
Tell pt. - The options listed are older meds. I would like to try something else to see if it is covered. I sent Pristiq to the mail order. Hopefully, it will be covered. If not I'll keep looking. Thanks, WS

## 2016-12-08 NOTE — Telephone Encounter (Signed)
Pt notified of medication change Verbalizes understanding 

## 2016-12-09 DIAGNOSIS — M25571 Pain in right ankle and joints of right foot: Secondary | ICD-10-CM | POA: Diagnosis not present

## 2017-01-29 DIAGNOSIS — H43391 Other vitreous opacities, right eye: Secondary | ICD-10-CM | POA: Diagnosis not present

## 2017-01-29 DIAGNOSIS — H43811 Vitreous degeneration, right eye: Secondary | ICD-10-CM | POA: Diagnosis not present

## 2017-01-29 DIAGNOSIS — H35371 Puckering of macula, right eye: Secondary | ICD-10-CM | POA: Diagnosis not present

## 2017-02-24 DIAGNOSIS — Z6825 Body mass index (BMI) 25.0-25.9, adult: Secondary | ICD-10-CM | POA: Diagnosis not present

## 2017-02-24 DIAGNOSIS — Z1231 Encounter for screening mammogram for malignant neoplasm of breast: Secondary | ICD-10-CM | POA: Diagnosis not present

## 2017-02-24 DIAGNOSIS — Z01419 Encounter for gynecological examination (general) (routine) without abnormal findings: Secondary | ICD-10-CM | POA: Diagnosis not present

## 2017-02-24 DIAGNOSIS — Z3202 Encounter for pregnancy test, result negative: Secondary | ICD-10-CM | POA: Diagnosis not present

## 2017-02-24 DIAGNOSIS — Z124 Encounter for screening for malignant neoplasm of cervix: Secondary | ICD-10-CM | POA: Diagnosis not present

## 2017-07-16 DIAGNOSIS — H43391 Other vitreous opacities, right eye: Secondary | ICD-10-CM | POA: Diagnosis not present

## 2017-07-16 DIAGNOSIS — H2513 Age-related nuclear cataract, bilateral: Secondary | ICD-10-CM | POA: Diagnosis not present

## 2017-07-16 DIAGNOSIS — H35371 Puckering of macula, right eye: Secondary | ICD-10-CM | POA: Diagnosis not present

## 2017-07-16 DIAGNOSIS — H43811 Vitreous degeneration, right eye: Secondary | ICD-10-CM | POA: Diagnosis not present

## 2017-09-15 ENCOUNTER — Encounter: Payer: Self-pay | Admitting: Family Medicine

## 2017-09-15 ENCOUNTER — Ambulatory Visit (INDEPENDENT_AMBULATORY_CARE_PROVIDER_SITE_OTHER): Payer: BLUE CROSS/BLUE SHIELD | Admitting: Family Medicine

## 2017-09-15 VITALS — BP 137/81 | HR 84 | Temp 97.2°F | Ht 63.5 in | Wt 143.0 lb

## 2017-09-15 DIAGNOSIS — Z1211 Encounter for screening for malignant neoplasm of colon: Secondary | ICD-10-CM

## 2017-09-15 DIAGNOSIS — R10A0 Flank pain, unspecified side: Secondary | ICD-10-CM

## 2017-09-15 DIAGNOSIS — R1084 Generalized abdominal pain: Secondary | ICD-10-CM | POA: Diagnosis not present

## 2017-09-15 DIAGNOSIS — R109 Unspecified abdominal pain: Secondary | ICD-10-CM

## 2017-09-15 LAB — URINALYSIS
Bilirubin, UA: NEGATIVE
Glucose, UA: NEGATIVE
Ketones, UA: NEGATIVE
LEUKOCYTES UA: NEGATIVE
NITRITE UA: NEGATIVE
PH UA: 7.5 (ref 5.0–7.5)
Protein, UA: NEGATIVE
RBC UA: NEGATIVE
Specific Gravity, UA: 1.01 (ref 1.005–1.030)
Urobilinogen, Ur: 0.2 mg/dL (ref 0.2–1.0)

## 2017-09-15 NOTE — Progress Notes (Signed)
Subjective:  Patient ID: Lynn Fernandez, female    DOB: 03/14/1966  Age: 51 y.o. MRN: [id number]  CC: Flank Pain (pt here today c/o right flank pain and bowel irregularity)   HPI Lynn Fernandez presents for right lower back pain. Points to flank area. Present for 2 months but had been decreasing until 1 1/2 weeks ago when it started increasing again and radiating into the right lower quadrant. Denies diarrhea, but having some variability to stools.   Depression screen [facility] 2/9 12/02/2016 11/05/2016  Decreased Interest 0 0  Down, Depressed, Hopeless 0 0  PHQ - 2 Score 0 0    History Lynn Fernandez has a past medical history of Allergic rhinitis, Dyslipidemia, GERD (gastroesophageal reflux disease), and Hyperlipidemia.   She has a past surgical history that includes pyloric stenosis and Esophagogastroduodenoscopy (10/05).   Her family history includes Alcohol abuse in her father; Cancer in her father and paternal grandmother; Diabetes in her paternal uncle; Heart disease in her father and maternal grandfather; Hyperlipidemia in her father.She reports that  has never smoked. she has never used smokeless tobacco. She reports that she drinks about 1.8 oz of alcohol per week. She reports that she does not use drugs.    ROS Review of Systems  Constitutional: Negative for activity change, appetite change and fever.  HENT: Negative for congestion, rhinorrhea and sore throat.   Eyes: Negative for visual disturbance.  Respiratory: Negative for cough and shortness of breath.   Cardiovascular: Negative for chest pain and palpitations.  Gastrointestinal: Positive for abdominal pain. Negative for diarrhea and nausea.  Genitourinary: Negative for dysuria.  Musculoskeletal: Negative for arthralgias and myalgias.    Objective:  BP 137/81   Pulse 84   Temp (!) 97.2 F (36.2 C) (Oral)   Ht 5' 3.5" (1.613 m)   Wt 143 lb (64.9 kg)   BMI 24.93 kg/m   BP Readings from Last 3 Encounters:    09/15/17 137/81  12/02/16 136/72  11/05/16 135/89    Wt Readings from Last 3 Encounters:  09/15/17 143 lb (64.9 kg)  12/02/16 139 lb (63 kg)  11/05/16 137 lb 6 oz (62.3 kg)     Physical Exam  Constitutional: She is oriented to person, place, and time. She appears well-developed and well-nourished.  HENT:  Head: Normocephalic and atraumatic.  Cardiovascular: Normal rate and regular rhythm.  No murmur heard. Pulmonary/Chest: Effort normal and breath sounds normal.  Abdominal: Soft. Bowel sounds are normal. She exhibits no mass. There is tenderness. There is no rebound and no guarding.  Neurological: She is alert and oriented to person, place, and time.  Skin: Skin is warm and dry.  Psychiatric: She has a normal mood and affect. Her behavior is normal.      Assessment & Plan:   Lynn Fernandez was seen today for flank pain.  Diagnoses and all orders for this visit:  Flank pain -     Urine Culture -     Urinalysis  Abdominal pain, unspecified abdominal location -     Urine Culture -     Urinalysis  Generalized abdominal pain -     Lipase -     Amylase -     CBC with Differential/Platelet -     CMP14+EGFR -     CT ABDOMEN PELVIS W WO CONTRAST; Future  Screening for colon cancer -     Ambulatory referral to Gastroenterology       I have discontinued Reita Chard. Linton-Hall's vortioxetine  HBr and desvenlafaxine. I am also having her maintain her drospirenone-ethinyl estradiol, loratadine-pseudoephedrine, b complex vitamins, Lactobacillus (PROBIOTIC ACIDOPHILUS PO), ASCORBIC ACID PO, Vitamin D3, Acetaminophen-Caff-Pyrilamine (MIDOL COMPLETE PO), BLACK COHOSH EXTRACT PO, Magnesium, Multiple Vitamins-Minerals (MULTIVITAMIN ADULT PO), and pantoprazole.  Allergies as of 09/15/2017      Reactions   Penicillins       Medication List        Accurate as of 09/15/17 11:59 PM. Always use your most recent med list.          ASCORBIC ACID PO Take by mouth.   b complex  vitamins tablet Take 1 tablet by mouth daily.   BLACK COHOSH EXTRACT PO Take by mouth.   drospirenone-ethinyl estradiol 3-0.02 MG tablet Commonly known as:  YAZ,GIANVI,LORYNA Take 1 tablet by mouth daily.   loratadine-pseudoephedrine 10-240 MG 24 hr tablet Commonly known as:  CLARITIN-D 24-hour Take 1 tablet by mouth daily.   Magnesium 250 MG Tabs Take by mouth.   MIDOL COMPLETE PO Take by mouth.   MULTIVITAMIN ADULT PO Take by mouth.   pantoprazole 40 MG tablet Commonly known as:  PROTONIX Take 1 tablet (40 mg total) by mouth daily. For stomach   PROBIOTIC ACIDOPHILUS PO Take by mouth.   Vitamin D3 1000 units Caps Take by mouth.        Follow-up: Return in about 2 weeks (around 09/29/2017), or if symptoms worsen or fail to improve.  Claretta Fraise, M.D.

## 2017-09-16 LAB — CMP14+EGFR
A/G RATIO: 1.6 (ref 1.2–2.2)
ALK PHOS: 19 IU/L — AB (ref 39–117)
ALT: 34 IU/L — AB (ref 0–32)
AST: 31 IU/L (ref 0–40)
Albumin: 4.5 g/dL (ref 3.5–5.5)
BILIRUBIN TOTAL: 0.5 mg/dL (ref 0.0–1.2)
BUN/Creatinine Ratio: 13 (ref 9–23)
BUN: 11 mg/dL (ref 6–24)
CALCIUM: 10 mg/dL (ref 8.7–10.2)
CHLORIDE: 99 mmol/L (ref 96–106)
CO2: 21 mmol/L (ref 20–29)
Creatinine, Ser: 0.83 mg/dL (ref 0.57–1.00)
GFR calc Af Amer: 95 mL/min/{1.73_m2} (ref >59)
GFR calc non Af Amer: 82 mL/min/{1.73_m2} (ref >59)
GLOBULIN, TOTAL: 2.8 g/dL (ref 1.5–4.5)
Glucose: 85 mg/dL (ref 65–99)
POTASSIUM: 4.3 mmol/L (ref 3.5–5.2)
SODIUM: 137 mmol/L (ref 134–144)
Total Protein: 7.3 g/dL (ref 6.0–8.5)

## 2017-09-16 LAB — CBC WITH DIFFERENTIAL/PLATELET
BASOS ABS: 0 10*3/uL (ref 0.0–0.2)
Basos: 0 %
EOS (ABSOLUTE): 0.1 10*3/uL (ref 0.0–0.4)
EOS: 2 %
Hematocrit: 39.9 % (ref 34.0–46.6)
Hemoglobin: 13.6 g/dL (ref 11.1–15.9)
Immature Grans (Abs): 0 10*3/uL (ref 0.0–0.1)
Immature Granulocytes: 0 %
Lymphocytes Absolute: 2.1 10*3/uL (ref 0.7–3.1)
Lymphs: 31 %
MCH: 31.9 pg (ref 26.6–33.0)
MCHC: 34.1 g/dL (ref 31.5–35.7)
MCV: 94 fL (ref 79–97)
MONOCYTES: 7 %
MONOS ABS: 0.5 10*3/uL (ref 0.1–0.9)
NEUTROS PCT: 60 %
Neutrophils Absolute: 4.1 10*3/uL (ref 1.4–7.0)
PLATELETS: 351 10*3/uL (ref 150–379)
RBC: 4.26 x10E6/uL (ref 3.77–5.28)
RDW: 12.9 % (ref 12.3–15.4)
WBC: 6.8 10*3/uL (ref 3.4–10.8)

## 2017-09-16 LAB — AMYLASE: Amylase: 60 U/L (ref 31–124)

## 2017-09-16 LAB — LIPASE: Lipase: 24 U/L (ref 14–72)

## 2017-09-17 ENCOUNTER — Ambulatory Visit: Payer: BLUE CROSS/BLUE SHIELD | Admitting: Family Medicine

## 2017-09-17 LAB — URINE CULTURE

## 2017-09-19 ENCOUNTER — Encounter: Payer: Self-pay | Admitting: Family Medicine

## 2017-09-30 ENCOUNTER — Ambulatory Visit (HOSPITAL_COMMUNITY): Payer: BLUE CROSS/BLUE SHIELD

## 2017-09-30 DIAGNOSIS — K3189 Other diseases of stomach and duodenum: Secondary | ICD-10-CM | POA: Diagnosis not present

## 2017-09-30 DIAGNOSIS — D259 Leiomyoma of uterus, unspecified: Secondary | ICD-10-CM | POA: Diagnosis not present

## 2017-10-01 ENCOUNTER — Telehealth: Payer: Self-pay | Admitting: Family Medicine

## 2017-10-06 ENCOUNTER — Other Ambulatory Visit: Payer: Self-pay | Admitting: Family Medicine

## 2017-10-06 ENCOUNTER — Ambulatory Visit (HOSPITAL_COMMUNITY): Payer: BLUE CROSS/BLUE SHIELD

## 2017-10-06 NOTE — Telephone Encounter (Signed)
CAn you help me find it? I couldn't find it under imaging or care everywhere. Thanks, WS

## 2017-10-06 NOTE — Telephone Encounter (Signed)
Spoke with [facility] and they are re faxing to [phone].

## 2017-10-08 ENCOUNTER — Other Ambulatory Visit: Payer: Self-pay | Admitting: *Deleted

## 2017-10-08 DIAGNOSIS — R9389 Abnormal findings on diagnostic imaging of other specified body structures: Secondary | ICD-10-CM

## 2017-10-14 ENCOUNTER — Encounter: Payer: Self-pay | Admitting: Family Medicine

## 2017-10-14 ENCOUNTER — Encounter (INDEPENDENT_AMBULATORY_CARE_PROVIDER_SITE_OTHER): Payer: Self-pay | Admitting: Internal Medicine

## 2017-10-26 ENCOUNTER — Ambulatory Visit (INDEPENDENT_AMBULATORY_CARE_PROVIDER_SITE_OTHER): Payer: BLUE CROSS/BLUE SHIELD | Admitting: Internal Medicine

## 2017-11-18 ENCOUNTER — Telehealth: Payer: Self-pay | Admitting: Family Medicine

## 2017-11-18 NOTE — Telephone Encounter (Signed)
Patient called stating she doesn't have her first appt with gastro doctor until 3 weeks from now. She is still very constipated and very uncomfortable.She has increased her fiber as well. She would like to know what can be done until her gastro appt.

## 2017-11-18 NOTE — Telephone Encounter (Signed)
Per Dr Oswaldo DoneVincent pt should increase water intake and take miralax up to every 2 hours if needed until she starts to have a bowel movement. Attempted to call pt, no answer so lmtcb.

## 2017-11-19 NOTE — Telephone Encounter (Signed)
Pt aware.

## 2017-11-30 ENCOUNTER — Encounter (INDEPENDENT_AMBULATORY_CARE_PROVIDER_SITE_OTHER): Payer: Self-pay | Admitting: Internal Medicine

## 2017-11-30 ENCOUNTER — Ambulatory Visit (INDEPENDENT_AMBULATORY_CARE_PROVIDER_SITE_OTHER): Payer: BLUE CROSS/BLUE SHIELD | Admitting: Internal Medicine

## 2017-11-30 ENCOUNTER — Telehealth (INDEPENDENT_AMBULATORY_CARE_PROVIDER_SITE_OTHER): Payer: Self-pay | Admitting: *Deleted

## 2017-11-30 ENCOUNTER — Encounter (INDEPENDENT_AMBULATORY_CARE_PROVIDER_SITE_OTHER): Payer: Self-pay | Admitting: *Deleted

## 2017-11-30 ENCOUNTER — Other Ambulatory Visit (INDEPENDENT_AMBULATORY_CARE_PROVIDER_SITE_OTHER): Payer: Self-pay | Admitting: Internal Medicine

## 2017-11-30 VITALS — BP 122/90 | HR 76 | Temp 97.4°F | Resp 18 | Ht 63.0 in | Wt 142.2 lb

## 2017-11-30 DIAGNOSIS — R935 Abnormal findings on diagnostic imaging of other abdominal regions, including retroperitoneum: Secondary | ICD-10-CM

## 2017-11-30 DIAGNOSIS — K5900 Constipation, unspecified: Secondary | ICD-10-CM

## 2017-11-30 DIAGNOSIS — Z1211 Encounter for screening for malignant neoplasm of colon: Secondary | ICD-10-CM

## 2017-11-30 MED ORDER — POLYETHYLENE GLYCOL 3350 17 G PO PACK: 17.0000 g | PACK | Freq: Every day | ORAL | 0 refills | Status: DC

## 2017-11-30 MED ORDER — PEG 3350-KCL-NA BICARB-NACL 420 G PO SOLR: 4000.0000 mL | Freq: Once | ORAL | 0 refills | Status: AC

## 2017-11-30 NOTE — Patient Instructions (Signed)
Stool diary for 2 weeks as discussed. Esophagogastroduodenoscopy and colonoscopy to be scheduled.

## 2017-11-30 NOTE — Progress Notes (Signed)
Reason for consultation;  Gastric wall thickening noted on CT of December 2018. Patient also interested in screening colonoscopy.  History of present illness:  Patient is 52 year old Caucasian female who is referred through courtesy of Dr. Mechele ClaudeWarren Stacks for GI evaluation. Patient underwent abdominopelvic CT in December 2018 for right flank abdominal pain.  She was noted to have wall thickening the gastric fundus.  She was also noted to have moderate stool burden in her colon.  Patient was therefore referred for further evaluation. Patient denies epigastric pain  or vomiting.  She has occasional nausea.  She was diagnosed with GERD in 1998.  Her symptoms are well controlled with therapy.  She also denies dysphagia.  She has taken Nexium in the past but she feels pantoprazole has worked better for her. Patient states she has been constipated all of her life.  She has used DIRECTVFleet Enema as no more than 4-5 times in her lifetime.  She has gone as long as 7-10 days without a bowel movement.  Without any help she would have no more than 1 BM per week.  She has been on Benefiber for about 2 months.  She is also using polyethylene glycol 2-3 times a week.  She is doing better.  She is having at least some bowel movement every day.  She rarely has sense of incomplete evacuation.  She denies melena or rectal bleeding.  She has been advised screening colonoscopy since her 51th birthday.  Patient states she is trying to increase intake of fiber rich foods.  However solids because abdominal pain and flatulence. She has never taken OTC stimulant laxatives such as senna or Ex-Lax and she also has not taken any prescription medications for constipation. Her appetite is normal.  She may have lost few pounds recently. She states lately she has been very sedentary.  She also has been under a lot of stress on account of her mother-in-law's illness who has been struggling with cancer for the last 12 years and now doing very  poorly.   Current Medications: Outpatient Encounter Medications as of 11/30/2017  Medication Sig  . Acetaminophen-Caff-Pyrilamine (MIDOL COMPLETE PO) Take by mouth as needed.   . ASCORBIC ACID PO Take 1,000 mg by mouth.   Marland Kitchen. b complex vitamins tablet Take 1 tablet by mouth daily.  Marland Kitchen. BLACK COHOSH EXTRACT PO Take by mouth daily.   . Cetirizine-Pseudoephedrine (ZYRTEC-D PO) Take by mouth.  . Cholecalciferol (VITAMIN D3) 1000 units CAPS Take 1,000 Units by mouth.   . drospirenone-ethinyl estradiol (YAZ,GIANVI,LORYNA) 3-0.02 MG tablet Take 1 tablet by mouth daily.  . Magnesium 250 MG TABS Take 500 mg by mouth daily.   . Multiple Vitamins-Minerals (MULTIVITAMIN ADULT PO) Take by mouth daily.   . pantoprazole (PROTONIX) 40 MG tablet Take 1 tablet (40 mg total) by mouth daily. For stomach  . Pseudoephedrine-Ibuprofen (ADVIL COLD & SINUS LIQUI-GELS PO) Take by mouth as needed.  . Wheat Dextrin (BENEFIBER) POWD Take by mouth.  . [DISCONTINUED] Lactobacillus (PROBIOTIC ACIDOPHILUS PO) Take by mouth.  . [DISCONTINUED] loratadine-pseudoephedrine (CLARITIN-D 24-HOUR) 10-240 MG 24 hr tablet Take 1 tablet by mouth daily.   No facility-administered encounter medications on file as of 11/30/2017.    Past medical history:  Pyloroplasty for congenital pyloric stenosis at age 89 weeks. GERD since 1998.  She had normal EGD in October 2005 by Dr. Madilyn FiremanHayes. History of uterine fibroids. Chronic constipation. Allergic rhinitis.  Allergies:  Allergies  Allergen Reactions  . Penicillins     Patient  states that it was high doses. She is not exactly sure of reaction.   Family history:  Father is 17 years old.  He was diagnosed with prostate carcinoma in 2016.  He also has coronary artery disease. Mother is also 53 years old with history of retinal tears and hyperlipidemia and migraine. She has 1 sister in good health.  Social history:  She is married but does not have any children.  She has never smoked  cigarettes.  She drinks alcohol socially.  She drinks no more than 1 drink of wine daily. 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  Physical examination: Blood pressure 122/90, pulse 76, temperature (!) 97.4 F (36.3 C), temperature source Oral, resp. rate 18, height 5\' 3"  (1.6 m), weight 142 lb 3.2 oz (64.5 kg). Patient is alert and in no acute distress. Conjunctiva is pink. Sclera is nonicteric Oropharyngeal mucosa is normal. No neck masses or thyromegaly noted. Cardiac exam with regular rhythm normal S1 and S2. No murmur or gallop noted. Lungs are clear to auscultation. Abdomen is symmetrical.  Bowel sounds are normal.  On palpation abdomen is soft and nontender without organomegaly or masses. No LE edema or clubbing noted.  Labs/studies Results:  Abdominopelvic CT from 09/30/2017 ( [facility]) performed for right flank/abdominal pain. Diffuse gastric wall thickening involving gastric fundus.  This could be due to under distention but cannot rule out other abnormalities. Uterine fibroid measuring 6.5 cm. Moderate stool burden.  Assessment:  #1.  Abnormal CT.  He was noted to have gastric wall thickening.  She does not have alarm symptoms.  This is possibly due to under distention but one cannot be absolutely certain.  Therefore diagnostic EGD would be recommended.  #2.  Chronic GERD.  She is doing well with therapy.  Last EGD 2005 was normal.  #3.  Chronic constipation.  She is doing better with current therapy.  He needs to increase intake of fiber rich foods.  Will monitor her bowel movements on current therapy before other options considered.  Recommendations:  Continue Benefiber 4 g once or twice daily. Polyethylene glycol 17 g p.o. nightly.  Patient can titrate dose and could also use every other day depending on stool frequency. Stool diary for the next 2 weeks or until the time of endoscopic evaluation. Diagnostic EGD and average risk screening  colonoscopy under monitored anesthesia care in near future. Office visit in 3 months.

## 2017-11-30 NOTE — Telephone Encounter (Signed)
Patient needs trilyte 

## 2017-11-30 NOTE — Telephone Encounter (Signed)
error    This encounter was created in error - please disregard.

## 2017-12-08 ENCOUNTER — Other Ambulatory Visit: Payer: Self-pay | Admitting: Family Medicine

## 2017-12-08 DIAGNOSIS — K2100 Gastro-esophageal reflux disease with esophagitis, without bleeding: Secondary | ICD-10-CM

## 2017-12-10 NOTE — Patient Instructions (Signed)
Lynn Fernandez  12/10/2017     @PREFPERIOPPHARMACY @   Your procedure is scheduled on 12/17/2017.  [facility].  Call this number if you have problems the morning of surgery:  [phone]   Remember:  Do not eat food or drink liquids after midnight.  Take these medicines the morning of surgery with A SIP OF WATER Zyrtec, Protonix   Do not wear jewelry, make-up or nail polish.  Do not wear lotions, powders, or perfumes, or deodorant.  Do not shave 48 hours prior to surgery.  Men may shave face and neck.  Do not bring valuables to the hospital.  [facility] is not [facility] any belongings or valuables.  Contacts, dentures or bridgework may not be worn into surgery.  Leave your suitcase in the car.  After surgery it may be brought to your room.  For patients admitted to the hospital, discharge time will be determined by your treatment team.  Patients discharged the day of surgery will not be allowed to drive home.    Please read over the following fact sheets that you were given. Anesthesia Post-op Instructions     PATIENT INSTRUCTIONS POST-ANESTHESIA  IMMEDIATELY FOLLOWING SURGERY:  Do not drive or operate machinery for the first twenty four hours after surgery.  Do not make any important decisions for twenty four hours after surgery or while taking narcotic pain medications or sedatives.  If you develop intractable nausea and vomiting or a severe headache please notify your doctor immediately.  FOLLOW-UP:  Please make an appointment with your surgeon as instructed. You do not need to follow up with anesthesia unless specifically instructed to do so.  WOUND CARE INSTRUCTIONS (if applicable):  Keep a dry clean dressing on the anesthesia/puncture wound site if there is drainage.  Once the wound has quit draining you may leave it open to air.  Generally you should leave the bandage intact for twenty four hours unless there is drainage.  If the  epidural site drains for more than 36-48 hours please call the anesthesia department.  QUESTIONS?:  Please feel free to call your physician or the hospital operator if you have any questions, and they will be happy to assist you.      Colonoscopy, Adult A colonoscopy is an exam to look at the entire large intestine. During the exam, a lubricated, bendable tube is inserted into the anus and then passed into the rectum, colon, and other parts of the large intestine. A colonoscopy is often done as a part of normal colorectal screening or in response to certain symptoms, such as anemia, persistent diarrhea, abdominal pain, and blood in the stool. The exam can help screen for and diagnose medical problems, including:  Tumors.  Polyps.  Inflammation.  Areas of bleeding.  Tell a health care provider about:  Any allergies you have.  All medicines you are taking, including vitamins, herbs, eye drops, creams, and over-the-counter medicines.  Any problems you or family members have had with anesthetic medicines.  Any blood disorders you have.  Any surgeries you have had.  Any medical conditions you have.  Any problems you have had passing stool. What are the risks? Generally, this is a safe procedure. However, problems may occur, including:  Bleeding.  A tear in the intestine.  A reaction to medicines given during the exam.  Infection (rare).  What happens before the procedure? Eating and drinking restrictions Follow instructions from your health care provider about  eating and drinking, which may include:  A few days before the procedure - follow a low-fiber diet. Avoid nuts, seeds, dried fruit, raw fruits, and vegetables.  1-3 days before the procedure - follow a clear liquid diet. Drink only clear liquids, such as clear broth or bouillon, black coffee or tea, clear juice, clear soft drinks or sports drinks, gelatin dessert, and popsicles. Avoid any liquids that contain red or  purple dye.  On the day of the procedure - do not eat or drink anything during the 2 hours before the procedure, or within the time period that your health care provider recommends.  Bowel prep If you were prescribed an oral bowel prep to clean out your colon:  Take it as told by your health care provider. Starting the day before your procedure, you will need to drink a large amount of medicated liquid. The liquid will cause you to have multiple loose stools until your stool is almost clear or light green.  If your skin or anus gets irritated from diarrhea, you may use these to relieve the irritation: ? Medicated wipes, such as adult wet wipes with aloe and vitamin E. ? A skin soothing-product like petroleum jelly.  If you vomit while drinking the bowel prep, take a break for up to 60 minutes and then begin the bowel prep again. If vomiting continues and you cannot take the bowel prep without vomiting, call your health care provider.  General instructions  Ask your health care provider about changing or stopping your regular medicines. This is especially important if you are taking diabetes medicines or blood thinners.  Plan to have someone take you home from the hospital or clinic. What happens during the procedure?  An IV tube may be inserted into one of your veins.  You will be given medicine to help you relax (sedative).  To reduce your risk of infection: ? Your health care team will wash or sanitize their hands. ? Your anal area will be washed with soap.  You will be asked to lie on your side with your knees bent.  Your health care provider will lubricate a long, thin, flexible tube. The tube will have a camera and a light on the end.  The tube will be inserted into your anus.  The tube will be gently eased through your rectum and colon.  Air will be delivered into your colon to keep it open. You may feel some pressure or cramping.  The camera will be used to take images  during the procedure.  A small tissue sample may be removed from your body to be examined under a microscope (biopsy). If any potential problems are found, the tissue will be sent to a lab for testing.  If small polyps are found, your health care provider may remove them and have them checked for cancer cells.  The tube that was inserted into your anus will be slowly removed. The procedure may vary among health care providers and hospitals. What happens after the procedure?  Your blood pressure, heart rate, breathing rate, and blood oxygen level will be monitored until the medicines you were given have worn off.  Do not drive for 24 hours after the exam.  You may have a small amount of blood in your stool.  You may pass gas and have mild abdominal cramping or bloating due to the air that was used to inflate your colon during the exam.  It is up to you to get the results  of your procedure. Ask your health care provider, or the department performing the procedure, when your results will be ready. This information is not intended to replace advice given to you by your health care provider. Make sure you discuss any questions you have with your health care provider. Document Released: 09/25/2000 Document Revised: 07/29/2016 Document Reviewed: 12/10/2015 Elsevier Interactive Patient Education  2018 [employer] Esophagogastroduodenoscopy Esophagogastroduodenoscopy (EGD) is a procedure to examine the lining of the esophagus, stomach, and first part of the small intestine (duodenum). This procedure is done to check for problems such as inflammation, bleeding, ulcers, or growths. During this procedure, a long, flexible, lighted tube with a camera attached (endoscope) is inserted down the throat. Tell a health care provider about:  Any allergies you have.  All medicines you are taking, including vitamins, herbs, eye drops, creams, and over-the-counter medicines.  Any problems you or family  members have had with anesthetic medicines.  Any blood disorders you have.  Any surgeries you have had.  Any medical conditions you have.  Whether you are pregnant or may be pregnant. What are the risks? Generally, this is a safe procedure. However, problems may occur, including:  Infection.  Bleeding.  A tear (perforation) in the esophagus, stomach, or duodenum.  Trouble breathing.  Excessive sweating.  Spasms of the larynx.  A slowed heartbeat.  Low blood pressure.  What happens before the procedure?  Follow instructions from your health care provider about eating or drinking restrictions.  Ask your health care provider about: ? Changing or stopping your regular medicines. This is especially important if you are taking diabetes medicines or blood thinners. ? Taking medicines such as aspirin and ibuprofen. These medicines can thin your blood. Do not take these medicines before your procedure if your health care provider instructs you not to.  Plan to have someone take you home after the procedure.  If you wear dentures, be ready to remove them before the procedure. What happens during the procedure?  To reduce your risk of infection, your health care team will wash or sanitize their hands.  An IV tube will be put in a vein in your hand or arm. You will get medicines and fluids through this tube.  You will be given one or more of the following: ? A medicine to help you relax (sedative). ? A medicine to numb the area (local anesthetic). This medicine may be sprayed into your throat. It will make you feel more comfortable and keep you from gagging or coughing during the procedure. ? A medicine for pain.  A mouth guard may be placed in your mouth to protect your teeth and to keep you from biting on the endoscope.  You will be asked to lie on your left side.  The endoscope will be lowered down your throat into your esophagus, stomach, and duodenum.  Air will be put  into the endoscope. This will help your health care provider see better.  The lining of your esophagus, stomach, and duodenum will be examined.  Your health care provider may: ? Take a tissue sample so it can be looked at in a lab (biopsy). ? Remove growths. ? Remove objects (foreign bodies) that are stuck. ? Treat any bleeding with medicines or other devices that stop tissue from bleeding. ? Widen (dilate) or stretch narrowed areas of your esophagus and stomach.  The endoscope will be taken out. The procedure may vary among health care providers and hospitals. What happens after the procedure?  Your blood pressure, heart rate, breathing rate, and blood oxygen level will be monitored often until the medicines you were given have worn off.  Do not eat or drink anything until the numbing medicine has worn off and your gag reflex has returned. This information is not intended to replace advice given to you by your health care provider. Make sure you discuss any questions you have with your health care provider. Document Released: 01/29/2005 Document Revised: 03/05/2016 Document Reviewed: 08/22/2015 Elsevier Interactive Patient Education  [employer].

## 2017-12-14 ENCOUNTER — Other Ambulatory Visit (HOSPITAL_COMMUNITY): Payer: BLUE CROSS/BLUE SHIELD

## 2017-12-14 ENCOUNTER — Encounter (HOSPITAL_COMMUNITY): Payer: Self-pay

## 2017-12-14 ENCOUNTER — Encounter (HOSPITAL_COMMUNITY)
Admission: RE | Admit: 2017-12-14 | Discharge: 2017-12-14 | Disposition: A | Discharge status: Home / Self Care | Payer: BLUE CROSS/BLUE SHIELD | Source: Ambulatory Visit | Attending: Internal Medicine | Admitting: Internal Medicine

## 2017-12-17 ENCOUNTER — Encounter (HOSPITAL_COMMUNITY): Admission: RE | Payer: Self-pay | Source: Ambulatory Visit

## 2017-12-17 ENCOUNTER — Ambulatory Visit (HOSPITAL_COMMUNITY): Admit: 2017-12-17 | Payer: BLUE CROSS/BLUE SHIELD | Admitting: Internal Medicine

## 2017-12-17 ENCOUNTER — Encounter (HOSPITAL_COMMUNITY): Payer: Self-pay

## 2017-12-17 ENCOUNTER — Ambulatory Visit (HOSPITAL_COMMUNITY)
Admission: RE | Admit: 2017-12-17 | Payer: BLUE CROSS/BLUE SHIELD | Source: Ambulatory Visit | Admitting: Internal Medicine

## 2017-12-17 SURGERY — COLONOSCOPY WITH PROPOFOL
Anesthesia: Monitor Anesthesia Care

## 2018-01-20 ENCOUNTER — Other Ambulatory Visit (INDEPENDENT_AMBULATORY_CARE_PROVIDER_SITE_OTHER): Payer: Self-pay | Admitting: *Deleted

## 2018-01-20 ENCOUNTER — Encounter (INDEPENDENT_AMBULATORY_CARE_PROVIDER_SITE_OTHER): Payer: Self-pay | Admitting: *Deleted

## 2018-01-20 DIAGNOSIS — K3189 Other diseases of stomach and duodenum: Secondary | ICD-10-CM

## 2018-01-20 DIAGNOSIS — Z1211 Encounter for screening for malignant neoplasm of colon: Secondary | ICD-10-CM

## 2018-01-25 DIAGNOSIS — H43391 Other vitreous opacities, right eye: Secondary | ICD-10-CM | POA: Diagnosis not present

## 2018-01-25 DIAGNOSIS — H35371 Puckering of macula, right eye: Secondary | ICD-10-CM | POA: Diagnosis not present

## 2018-01-25 DIAGNOSIS — H43811 Vitreous degeneration, right eye: Secondary | ICD-10-CM | POA: Diagnosis not present

## 2018-01-25 DIAGNOSIS — H2513 Age-related nuclear cataract, bilateral: Secondary | ICD-10-CM | POA: Diagnosis not present

## 2018-02-04 NOTE — Patient Instructions (Signed)
Lynn Fernandez  02/04/2018     @PREFPERIOPPHARMACY @   Your procedure is scheduled on 02/11/2018.  Report to [facility] at 6:15 A.M.  Call this number if you have problems the morning of surgery:  [phone]   Remember:  Do not eat food or drink liquids after midnight.  Take these medicines the morning of surgery with A SIP OF WATER Zyrtec, Protonix   Do not wear jewelry, make-up or nail polish.  Do not wear lotions, powders, or perfumes, or deodorant.  Do not shave 48 hours prior to surgery.  Men may shave face and neck.  Do not bring valuables to the hospital.  [facility] is not responsible for any belongings or valuables.  Contacts, dentures or bridgework may not be worn into surgery.  Leave your suitcase in the car.  After surgery it may be brought to your room.  For patients admitted to the hospital, discharge time will be determined by your treatment team.  Patients discharged the day of surgery will not be allowed to drive home.    Please read over the following fact sheets that you were given. Anesthesia Post-op Instructions     PATIENT INSTRUCTIONS POST-ANESTHESIA  IMMEDIATELY FOLLOWING SURGERY:  Do not drive or operate machinery for the first twenty four hours after surgery.  Do not make any important decisions for twenty four hours after surgery or while taking narcotic pain medications or sedatives.  If you develop intractable nausea and vomiting or a severe headache please notify your doctor immediately.  FOLLOW-UP:  Please make an appointment with your surgeon as instructed. You do not need to follow up with anesthesia unless specifically instructed to do so.  WOUND CARE INSTRUCTIONS (if applicable):  Keep a dry clean dressing on the anesthesia/puncture wound site if there is drainage.  Once the wound has quit draining you may leave it open to air.  Generally you should leave the bandage intact for twenty four hours unless there is drainage.  If the  epidural site drains for more than 36-48 hours please call the anesthesia department.  QUESTIONS?:  Please feel free to call your physician or the hospital operator if you have any questions, and they will be happy to assist you.      Colonoscopy, Adult A colonoscopy is an exam to look at the entire large intestine. During the exam, a lubricated, bendable tube is inserted into the anus and then passed into the rectum, colon, and other parts of the large intestine. A colonoscopy is often done as a part of normal colorectal screening or in response to certain symptoms, such as anemia, persistent diarrhea, abdominal pain, and blood in the stool. The exam can help screen for and diagnose medical problems, including:  Tumors.  Polyps.  Inflammation.  Areas of bleeding.  Tell a health care provider about:  Any allergies you have.  All medicines you are taking, including vitamins, herbs, eye drops, creams, and over-the-counter medicines.  Any problems you or family members have had with anesthetic medicines.  Any blood disorders you have.  Any surgeries you have had.  Any medical conditions you have.  Any problems you have had passing stool. What are the risks? Generally, this is a safe procedure. However, problems may occur, including:  Bleeding.  A tear in the intestine.  A reaction to medicines given during the exam.  Infection (rare).  What happens before the procedure? Eating and drinking restrictions Follow instructions from your health care provider about  eating and drinking, which may include:  A few days before the procedure - follow a low-fiber diet. Avoid nuts, seeds, dried fruit, raw fruits, and vegetables.  1-3 days before the procedure - follow a clear liquid diet. Drink only clear liquids, such as clear broth or bouillon, black coffee or tea, clear juice, clear soft drinks or sports drinks, gelatin dessert, and popsicles. Avoid any liquids that contain red or  purple dye.  On the day of the procedure - do not eat or drink anything during the 2 hours before the procedure, or within the time period that your health care provider recommends.  Bowel prep If you were prescribed an oral bowel prep to clean out your colon:  Take it as told by your health care provider. Starting the day before your procedure, you will need to drink a large amount of medicated liquid. The liquid will cause you to have multiple loose stools until your stool is almost clear or light green.  If your skin or anus gets irritated from diarrhea, you may use these to relieve the irritation: ? Medicated wipes, such as adult wet wipes with aloe and vitamin E. ? A skin soothing-product like petroleum jelly.  If you vomit while drinking the bowel prep, take a break for up to 60 minutes and then begin the bowel prep again. If vomiting continues and you cannot take the bowel prep without vomiting, call your health care provider.  General instructions  Ask your health care provider about changing or stopping your regular medicines. This is especially important if you are taking diabetes medicines or blood thinners.  Plan to have someone take you home from the hospital or clinic. What happens during the procedure?  An IV tube may be inserted into one of your veins.  You will be given medicine to help you relax (sedative).  To reduce your risk of infection: ? Your health care team will wash or sanitize their hands. ? Your anal area will be washed with soap.  You will be asked to lie on your side with your knees bent.  Your health care provider will lubricate a long, thin, flexible tube. The tube will have a camera and a light on the end.  The tube will be inserted into your anus.  The tube will be gently eased through your rectum and colon.  Air will be delivered into your colon to keep it open. You may feel some pressure or cramping.  The camera will be used to take images  during the procedure.  A small tissue sample may be removed from your body to be examined under a microscope (biopsy). If any potential problems are found, the tissue will be sent to a lab for testing.  If small polyps are found, your health care provider may remove them and have them checked for cancer cells.  The tube that was inserted into your anus will be slowly removed. The procedure may vary among health care providers and hospitals. What happens after the procedure?  Your blood pressure, heart rate, breathing rate, and blood oxygen level will be monitored until the medicines you were given have worn off.  Do not drive for 24 hours after the exam.  You may have a small amount of blood in your stool.  You may pass gas and have mild abdominal cramping or bloating due to the air that was used to inflate your colon during the exam.  It is up to you to get the results  of your procedure. Ask your health care provider, or the department performing the procedure, when your results will be ready. This information is not intended to replace advice given to you by your health care provider. Make sure you discuss any questions you have with your health care provider. Document Released: 09/25/2000 Document Revised: 07/29/2016 Document Reviewed: 12/10/2015 Elsevier Interactive Patient Education  2018 [employer] Esophagogastroduodenoscopy Esophagogastroduodenoscopy (EGD) is a procedure to examine the lining of the esophagus, stomach, and first part of the small intestine (duodenum). This procedure is done to check for problems such as inflammation, bleeding, ulcers, or growths. During this procedure, a long, flexible, lighted tube with a camera attached (endoscope) is inserted down the throat. Tell a health care provider about:  Any allergies you have.  All medicines you are taking, including vitamins, herbs, eye drops, creams, and over-the-counter medicines.  Any problems you or family  members have had with anesthetic medicines.  Any blood disorders you have.  Any surgeries you have had.  Any medical conditions you have.  Whether you are pregnant or may be pregnant. What are the risks? Generally, this is a safe procedure. However, problems may occur, including:  Infection.  Bleeding.  A tear (perforation) in the esophagus, stomach, or duodenum.  Trouble breathing.  Excessive sweating.  Spasms of the larynx.  A slowed heartbeat.  Low blood pressure.  What happens before the procedure?  Follow instructions from your health care provider about eating or drinking restrictions.  Ask your health care provider about: ? Changing or stopping your regular medicines. This is especially important if you are taking diabetes medicines or blood thinners. ? Taking medicines such as aspirin and ibuprofen. These medicines can thin your blood. Do not take these medicines before your procedure if your health care provider instructs you not to.  Plan to have someone take you home after the procedure.  If you wear dentures, be ready to remove them before the procedure. What happens during the procedure?  To reduce your risk of infection, your health care team will wash or sanitize their hands.  An IV tube will be put in a vein in your hand or arm. You will get medicines and fluids through this tube.  You will be given one or more of the following: ? A medicine to help you relax (sedative). ? A medicine to numb the area (local anesthetic). This medicine may be sprayed into your throat. It will make you feel more comfortable and keep you from gagging or coughing during the procedure. ? A medicine for pain.  A mouth guard may be placed in your mouth to protect your teeth and to keep you from biting on the endoscope.  You will be asked to lie on your left side.  The endoscope will be lowered down your throat into your esophagus, stomach, and duodenum.  Air will be put  into the endoscope. This will help your health care provider see better.  The lining of your esophagus, stomach, and duodenum will be examined.  Your health care provider may: ? Take a tissue sample so it can be looked at in a lab (biopsy). ? Remove growths. ? Remove objects (foreign bodies) that are stuck. ? Treat any bleeding with medicines or other devices that stop tissue from bleeding. ? Widen (dilate) or stretch narrowed areas of your esophagus and stomach.  The endoscope will be taken out. The procedure may vary among health care providers and hospitals. What happens after the procedure?  Your blood pressure, heart rate, breathing rate, and blood oxygen level will be monitored often until the medicines you were given have worn off.  Do not eat or drink anything until the numbing medicine has worn off and your gag reflex has returned. This information is not intended to replace advice given to you by your health care provider. Make sure you discuss any questions you have with your health care provider. Document Released: 01/29/2005 Document Revised: 03/05/2016 Document Reviewed: 08/22/2015 Elsevier Interactive Patient Education  [employer].

## 2018-02-07 ENCOUNTER — Other Ambulatory Visit: Payer: Self-pay

## 2018-02-07 ENCOUNTER — Encounter (HOSPITAL_COMMUNITY)
Admission: RE | Admit: 2018-02-07 | Discharge: 2018-02-07 | Disposition: A | Discharge status: Home / Self Care | Payer: BLUE CROSS/BLUE SHIELD | Source: Ambulatory Visit | Attending: Internal Medicine | Admitting: Internal Medicine

## 2018-02-07 ENCOUNTER — Encounter (HOSPITAL_COMMUNITY): Payer: Self-pay

## 2018-02-07 DIAGNOSIS — Z01812 Encounter for preprocedural laboratory examination: Secondary | ICD-10-CM | POA: Insufficient documentation

## 2018-02-07 DIAGNOSIS — K3189 Other diseases of stomach and duodenum: Secondary | ICD-10-CM

## 2018-02-07 DIAGNOSIS — Z1211 Encounter for screening for malignant neoplasm of colon: Secondary | ICD-10-CM

## 2018-02-07 LAB — BASIC METABOLIC PANEL
Anion gap: 11 (ref 5–15)
BUN: 9 mg/dL (ref 6–20)
CO2: 22 mmol/L (ref 22–32)
CREATININE: 0.72 mg/dL (ref 0.44–1.00)
Calcium: 9.9 mg/dL (ref 8.9–10.3)
Chloride: 104 mmol/L (ref 101–111)
Glucose, Bld: 113 mg/dL — ABNORMAL HIGH (ref 65–99)
POTASSIUM: 3.5 mmol/L (ref 3.5–5.1)
SODIUM: 137 mmol/L (ref 135–145)

## 2018-02-07 LAB — CBC
HCT: 38.4 % (ref 36.0–46.0)
Hemoglobin: 12.5 g/dL (ref 12.0–15.0)
MCH: 30.3 pg (ref 26.0–34.0)
MCHC: 32.6 g/dL (ref 30.0–36.0)
MCV: 93 fL (ref 78.0–100.0)
PLATELETS: 286 10*3/uL (ref 150–400)
RBC: 4.13 MIL/uL (ref 3.87–5.11)
RDW: 12.1 % (ref 11.5–15.5)
WBC: 5.8 10*3/uL (ref 4.0–10.5)

## 2018-02-07 LAB — HCG, SERUM, QUALITATIVE: Preg, Serum: NEGATIVE

## 2018-02-07 NOTE — Progress Notes (Signed)
   02/07/18 1358  OBSTRUCTIVE SLEEP APNEA  Have you ever been diagnosed with sleep apnea through a sleep study? No  Do you snore loudly (loud enough to be heard through closed doors)?  1  Do you often feel tired, fatigued, or sleepy during the daytime (such as falling asleep during driving or talking to someone)? 1  Has anyone observed you stop breathing during your sleep? 1  Do you have, or are you being treated for high blood pressure? 0  BMI more than 35 kg/m2? 0  Age > 50 (1-yes) 1  Neck circumference greater than:Female 16 inches or larger, Female 17inches or larger? 0  Female Gender (Yes=1) 0  Obstructive Sleep Apnea Score 4  Score 5 or greater  Results sent to PCP

## 2018-02-08 ENCOUNTER — Telehealth (INDEPENDENT_AMBULATORY_CARE_PROVIDER_SITE_OTHER): Payer: Self-pay | Admitting: Internal Medicine

## 2018-02-08 ENCOUNTER — Encounter (INDEPENDENT_AMBULATORY_CARE_PROVIDER_SITE_OTHER): Payer: Self-pay | Admitting: Internal Medicine

## 2018-02-08 NOTE — Telephone Encounter (Signed)
Patient called had some questions for Dr Karilyn Cota - please call back at [phone]

## 2018-02-09 ENCOUNTER — Encounter (INDEPENDENT_AMBULATORY_CARE_PROVIDER_SITE_OTHER): Payer: Self-pay | Admitting: Internal Medicine

## 2018-02-10 NOTE — Telephone Encounter (Signed)
I have talked with the patient and all her questions have been answered.

## 2018-02-11 ENCOUNTER — Ambulatory Visit (HOSPITAL_COMMUNITY)
Admission: RE | Admit: 2018-02-11 | Discharge: 2018-02-11 | Disposition: A | Discharge status: Home / Self Care | Payer: BLUE CROSS/BLUE SHIELD | Source: Ambulatory Visit | Attending: Internal Medicine | Admitting: Internal Medicine

## 2018-02-11 ENCOUNTER — Encounter (HOSPITAL_COMMUNITY): Payer: Self-pay

## 2018-02-11 ENCOUNTER — Encounter (HOSPITAL_COMMUNITY)
Admission: RE | Disposition: A | Discharge status: Home / Self Care | Payer: Self-pay | Source: Ambulatory Visit | Attending: Internal Medicine

## 2018-02-11 ENCOUNTER — Ambulatory Visit (HOSPITAL_COMMUNITY): Payer: BLUE CROSS/BLUE SHIELD | Admitting: Anesthesiology

## 2018-02-11 DIAGNOSIS — K219 Gastro-esophageal reflux disease without esophagitis: Secondary | ICD-10-CM | POA: Diagnosis not present

## 2018-02-11 DIAGNOSIS — K5909 Other constipation: Secondary | ICD-10-CM | POA: Insufficient documentation

## 2018-02-11 DIAGNOSIS — K317 Polyp of stomach and duodenum: Secondary | ICD-10-CM | POA: Diagnosis not present

## 2018-02-11 DIAGNOSIS — K6289 Other specified diseases of anus and rectum: Secondary | ICD-10-CM | POA: Insufficient documentation

## 2018-02-11 DIAGNOSIS — Z8249 Family history of ischemic heart disease and other diseases of the circulatory system: Secondary | ICD-10-CM | POA: Diagnosis not present

## 2018-02-11 DIAGNOSIS — Z880 Allergy status to penicillin: Secondary | ICD-10-CM | POA: Insufficient documentation

## 2018-02-11 DIAGNOSIS — K2950 Unspecified chronic gastritis without bleeding: Secondary | ICD-10-CM | POA: Diagnosis not present

## 2018-02-11 DIAGNOSIS — Z91040 Latex allergy status: Secondary | ICD-10-CM | POA: Diagnosis not present

## 2018-02-11 DIAGNOSIS — K625 Hemorrhage of anus and rectum: Secondary | ICD-10-CM | POA: Diagnosis not present

## 2018-02-11 DIAGNOSIS — Z1211 Encounter for screening for malignant neoplasm of colon: Secondary | ICD-10-CM | POA: Insufficient documentation

## 2018-02-11 DIAGNOSIS — K3189 Other diseases of stomach and duodenum: Secondary | ICD-10-CM

## 2018-02-11 DIAGNOSIS — E785 Hyperlipidemia, unspecified: Secondary | ICD-10-CM | POA: Insufficient documentation

## 2018-02-11 DIAGNOSIS — R933 Abnormal findings on diagnostic imaging of other parts of digestive tract: Secondary | ICD-10-CM | POA: Diagnosis not present

## 2018-02-11 DIAGNOSIS — K228 Other specified diseases of esophagus: Secondary | ICD-10-CM | POA: Diagnosis not present

## 2018-02-11 DIAGNOSIS — Z79899 Other long term (current) drug therapy: Secondary | ICD-10-CM | POA: Diagnosis not present

## 2018-02-11 DIAGNOSIS — Q438 Other specified congenital malformations of intestine: Secondary | ICD-10-CM | POA: Insufficient documentation

## 2018-02-11 HISTORY — PX: BIOPSY: SHX5522

## 2018-02-11 HISTORY — PX: ESOPHAGOGASTRODUODENOSCOPY (EGD) WITH PROPOFOL: SHX5813

## 2018-02-11 HISTORY — PX: COLONOSCOPY WITH PROPOFOL: SHX5780

## 2018-02-11 SURGERY — ESOPHAGOGASTRODUODENOSCOPY (EGD) WITH PROPOFOL
Anesthesia: Monitor Anesthesia Care

## 2018-02-11 MED ORDER — ACETAMINOPHEN 10 MG/ML IV SOLN: 1000.0000 mg | Freq: Once | INTRAVENOUS | Status: DC | PRN

## 2018-02-11 MED ORDER — HYDROMORPHONE HCL 1 MG/ML IJ SOLN: 0.2500 mg | INTRAMUSCULAR | Status: DC | PRN

## 2018-02-11 MED ORDER — LACTATED RINGERS IV SOLN
INTRAVENOUS | Status: DC
  Administered 2018-02-11: 07:00:00 via INTRAVENOUS

## 2018-02-11 MED ORDER — MIDAZOLAM HCL 5 MG/5ML IJ SOLN
INTRAMUSCULAR | Status: DC | PRN
  Administered 2018-02-11: 2 mg via INTRAVENOUS

## 2018-02-11 MED ORDER — LIDOCAINE VISCOUS 2 % MT SOLN
15.0000 mL | Freq: Once | OROMUCOSAL | Status: AC
  Administered 2018-02-11: 15 mL via OROMUCOSAL

## 2018-02-11 MED ORDER — MIDAZOLAM HCL 2 MG/2ML IJ SOLN
INTRAMUSCULAR | Status: AC
Start: 2018-02-11 — End: ?
  Filled 2018-02-11: qty 2

## 2018-02-11 MED ORDER — GLYCOPYRROLATE 0.2 MG/ML IJ SOLN
INTRAMUSCULAR | Status: AC
  Filled 2018-02-11: qty 1

## 2018-02-11 MED ORDER — PROMETHAZINE HCL 25 MG/ML IJ SOLN: 6.2500 mg | INTRAMUSCULAR | Status: DC | PRN

## 2018-02-11 MED ORDER — LIDOCAINE VISCOUS 2 % MT SOLN
OROMUCOSAL | Status: AC
  Filled 2018-02-11: qty 15

## 2018-02-11 MED ORDER — PROPOFOL 10 MG/ML IV BOLUS
INTRAVENOUS | Status: AC
  Filled 2018-02-11: qty 40

## 2018-02-11 MED ORDER — PROPOFOL 500 MG/50ML IV EMUL
INTRAVENOUS | Status: DC | PRN
  Administered 2018-02-11 (×2): via INTRAVENOUS
  Administered 2018-02-11: 125 ug/kg/min via INTRAVENOUS

## 2018-02-11 MED ORDER — GLYCOPYRROLATE 0.2 MG/ML IJ SOLN
INTRAMUSCULAR | Status: DC | PRN
  Administered 2018-02-11: 0.2 mg via INTRAVENOUS

## 2018-02-11 MED ORDER — MIDAZOLAM HCL 2 MG/2ML IJ SOLN: 0.5000 mg | Freq: Once | INTRAMUSCULAR | Status: DC | PRN

## 2018-02-11 NOTE — H&P (Signed)
Lynn Fernandez is an 52 y.o. female.   Chief Complaint: Patient is here for EGD and colonoscopy. HPI: Patient is 52 year old Caucasian female who has chronic GERD and is on PPI with good control of her symptoms.  Few months ago she had abdominopelvic CT for flank pain.  This study revealed gastric wall thickening fundus.  She was therefore advised diagnostic EGD.  She denies nausea vomiting or epigastric pain.  She also denies anorexia or weight loss or melena.  She is also undergoing screening colonoscopy.  She has chronic constipation and is doing well with Benefiber polyethylene glycol and dietary measures.  There is no history of rectal bleeding. Family history is negative for CRC.  Past Medical History:  Diagnosis Date  . Allergic rhinitis   . Dyslipidemia   . GERD (gastroesophageal reflux disease)   . Hyperlipidemia     Past Surgical History:  Procedure Laterality Date  . ESOPHAGOGASTRODUODENOSCOPY  10/05  . pyloric stenosis      Family History  Problem Relation Age of Onset  . Heart disease Father   . Hyperlipidemia Father   . Alcohol abuse Father   . Prostate cancer Father   . Diabetes Paternal Uncle   . Heart disease Paternal Uncle   . Heart disease Maternal Grandfather   . Cancer Paternal Grandmother   . Breast cancer Maternal Grandmother    Social History:  reports that she has never smoked. She has never used smokeless tobacco. She reports that she drinks about 1.8 oz of alcohol per week. She reports that she does not use drugs.  Allergies:  Allergies  Allergen Reactions  . Latex Other (See Comments)    Burning sensation   . Penicillins Other (See Comments)    Patient states that it was high doses. She is not exactly sure of reaction. Has patient had a PCN reaction causing immediate rash, facial/tongue/throat swelling, SOB or lightheadedness with hypotension: Unknown Has patient had a PCN reaction causing severe rash involving mucus membranes or skin  necrosis: Unknown Has patient had a PCN reaction that required hospitalization: Unknown Has patient had a PCN reaction occurring within the last 10 years: No If all of the above answers are "NO", then may pr    Medications Prior to Admission  Medication Sig Dispense Refill  . Acetaminophen-Caff-Pyrilamine (MIDOL COMPLETE PO) Take 2 tablets by mouth 2 (two) times daily as needed (menstrual cramps).     . ASCORBIC ACID PO Take 1,000 mg by mouth 2 (two) times a week.     . B Complex-C-Folic Acid (STRESS B COMPLEX PO) Take 1 tablet by mouth daily.    . Black Cohosh 540 MG CAPS Take 540 mg by mouth at bedtime.     . Calcium Carbonate-Vitamin D (CALCIUM 500 + D PO) Take 1 tablet by mouth every 7 (seven) days.    . Cetirizine-Pseudoephedrine (ZYRTEC-D PO) Take 1 tablet by mouth daily.     . Cholecalciferol 2000 units CAPS Take 2,000 Units by mouth every 7 (seven) days.    . drospirenone-ethinyl estradiol (YAZ,GIANVI,LORYNA) 3-0.02 MG tablet Take 1 tablet by mouth daily.    Marland Kitchen ibuprofen (ADVIL,MOTRIN) 200 MG tablet Take 400 mg by mouth every 8 (eight) hours as needed for headache.    . Magnesium 250 MG TABS Take 500 mg by mouth daily.     . Multiple Vitamins-Minerals (MULTIVITAMIN ADULT PO) Take 1 tablet by mouth daily.     . naproxen sodium (ALEVE) 220 MG tablet Take 220 mg  by mouth daily as needed (for arthritis pain).     . pantoprazole (PROTONIX) 40 MG tablet TAKE 1 TABLET DAILY FOR    STOMACH *CADISTA* 90 tablet 0  . polyethylene glycol (MIRALAX) packet Take 17 g by mouth daily. (Patient taking differently: Take 8.5 g by mouth every other day. )  0  . Propylene Glycol (SYSTANE BALANCE) 0.6 % SOLN Place 2 drops into both eyes 2 (two) times daily. Systane [employer] Eye drops     . Wheat Dextrin (BENEFIBER) POWD Take 1 each by mouth See admin instructions. Take 1 tablespoon by mouth twice daily      No results found for this or any previous visit (from the past 48 hour(s)). No results  found.  ROS  Blood pressure (!) 137/96, pulse 81, temperature 98 F (36.7 C), temperature source Oral, resp. rate 16, height  (1.626 m), weight 140 lb (63.5 kg), last menstrual period 01/29/2018, SpO2 100 %. Physical Exam  Constitutional: She appears well-developed and well-nourished.  HENT:  Mouth/Throat: Oropharynx is clear and moist.  Eyes: Conjunctivae are normal. No scleral icterus.  Neck: No thyromegaly present.  Cardiovascular: Normal rate, regular rhythm and normal heart sounds.  No murmur heard. Respiratory: Effort normal and breath sounds normal.  GI: Soft. She exhibits no distension and no mass. There is no tenderness.  Musculoskeletal: She exhibits no edema.  Lymphadenopathy:    She has no cervical adenopathy.  Neurological: She is alert.  Skin: Skin is warm and dry.     Assessment/Plan Gastric wall thickening on CT. Diagnostic esophagogastroduodenoscopy and average for screening colonoscopy.  Lionel December, MD 02/11/2018, 7:13 AM

## 2018-02-11 NOTE — Anesthesia Preprocedure Evaluation (Signed)
Anesthesia Evaluation  Patient identified by MRN, date of birth, ID band Patient awake    Reviewed: Allergy & Precautions, H&P , NPO status , Patient's Chart, lab work & pertinent test results, reviewed documented beta blocker date and time   Airway Mallampati: II  TM Distance: >3 FB Neck ROM: full    Dental no notable dental hx.    Pulmonary neg pulmonary ROS,    Pulmonary exam normal breath sounds clear to auscultation       Cardiovascular Exercise Tolerance: Good negative cardio ROS   Rhythm:regular Rate:Normal     Neuro/Psych negative neurological ROS  negative psych ROS   GI/Hepatic negative GI ROS, Neg liver ROS,   Endo/Other  negative endocrine ROS  Renal/GU negative Renal ROS  negative genitourinary   Musculoskeletal   Abdominal   Peds  Hematology negative hematology ROS (+)   Anesthesia Other Findings Seasonal allergies No clinical complaints Labs noted  Reproductive/Obstetrics negative OB ROS                             Anesthesia Physical Anesthesia Plan  ASA: II  Anesthesia Plan: MAC   Post-op Pain Management:    Induction:   PONV Risk Score and Plan:   Airway Management Planned:   Additional Equipment:   Intra-op Plan:   Post-operative Plan:   Informed Consent: I have reviewed the patients History and Physical, chart, labs and discussed the procedure including the risks, benefits and alternatives for the proposed anesthesia with the patient or authorized representative who has indicated his/her understanding and acceptance.   Dental Advisory Given  Plan Discussed with: CRNA  Anesthesia Plan Comments:         Anesthesia Quick Evaluation

## 2018-02-11 NOTE — Anesthesia Postprocedure Evaluation (Signed)
Anesthesia Post Note  Patient: Lynn Fernandez  Procedure(s) Performed: ESOPHAGOGASTRODUODENOSCOPY (EGD) WITH PROPOFOL (N/A ) COLONOSCOPY WITH PROPOFOL (N/A ) BIOPSY  Patient location during evaluation: PACU Anesthesia Type: MAC Level of consciousness: awake and alert and patient cooperative Pain management: pain level controlled Vital Signs Assessment: post-procedure vital signs reviewed and stable Respiratory status: spontaneous breathing, nonlabored ventilation and respiratory function stable Cardiovascular status: blood pressure returned to baseline Postop Assessment: no apparent nausea or vomiting Anesthetic complications: no     Last Vitals:  Vitals:   02/11/18 0637  BP: (!) 137/96  Pulse: 81  Resp: 16  Temp: 36.7 C  SpO2: 100%    Last Pain:  Vitals:   02/11/18 0729  TempSrc:   PainSc: 0-No pain                 Lucindy Borel J

## 2018-02-11 NOTE — Transfer of Care (Signed)
Immediate Anesthesia Transfer of Care Note  Patient: Lynn Fernandez  Procedure(s) Performed: ESOPHAGOGASTRODUODENOSCOPY (EGD) WITH PROPOFOL (N/A ) COLONOSCOPY WITH PROPOFOL (N/A ) BIOPSY  Patient Location: PACU  Anesthesia Type:MAC  Level of Consciousness: awake and patient cooperative  Airway & Oxygen Therapy: Patient Spontanous Breathing and Patient connected to nasal cannula oxygen  Post-op Assessment: Report given to RN, Post -op Vital signs reviewed and stable and Patient moving all extremities  Post vital signs: Reviewed and stable  Last Vitals:  Vitals Value Taken Time  BP    Temp    Pulse 62 02/11/2018  8:23 AM  Resp    SpO2 70 % 02/11/2018  8:23 AM  Vitals shown include unvalidated device data.  Last Pain:  Vitals:   02/11/18 0729  TempSrc:   PainSc: 0-No pain         Complications: No apparent anesthesia complications

## 2018-02-11 NOTE — Discharge Instructions (Signed)
No aspirin or NSAIDs for 24 hours. Resume other medications and diet as before. No driving for 24 hours. Physician will call with biopsy results next week.   Esophagogastroduodenoscopy, Care After Refer to this sheet in the next few weeks. These instructions provide you with information about caring for yourself after your procedure. Your health care provider may also give you more specific instructions. Your treatment has been planned according to current medical practices, but problems sometimes occur. Call your health care provider if you have any problems or questions after your procedure. What can I expect after the procedure? After the procedure, it is common to have:  A sore throat.  Nausea.  Bloating.  Dizziness.  Fatigue.  Follow these instructions at home:  Do not eat or drink anything until the numbing medicine (local anesthetic) has worn off and your gag reflex has returned. You will know that the local anesthetic has worn off when you can swallow comfortably.  Do not drive for 24 hours if you received a medicine to help you relax (sedative).  If your health care provider took a tissue sample for testing during the procedure, make sure to get your test results. This is your responsibility. Ask your health care provider or the department performing the test when your results will be ready.  Keep all follow-up visits as told by your health care provider. This is important. Contact a health care provider if:  You cannot stop coughing.  You are not urinating.  You are urinating less than usual. Get help right away if:  You have trouble swallowing.  You cannot eat or drink.  You have throat or chest pain that gets worse.  You are dizzy or light-headed.  You faint.  You have nausea or vomiting.  You have chills.  You have a fever.  You have severe abdominal pain.  You have black, tarry, or bloody stools. This information is not intended to replace  advice given to you by your health care provider. Make sure you discuss any questions you have with your health care provider. Document Released: 09/14/2012 Document Revised: 03/05/2016 Document Reviewed: 08/22/2015 Elsevier Interactive Patient Education  2018 [employer].   Colonoscopy, Adult, Care After This sheet gives you information about how to care for yourself after your procedure. Your doctor may also give you more specific instructions. If you have problems or questions, call your doctor. Follow these instructions at home: General instructions   For the first 24 hours after the procedure: ? Do not drive or use machinery. ? Do not sign important documents. ? Do not drink alcohol. ? Do your daily activities more slowly than normal. ? Eat foods that are soft and easy to digest. ? Rest often.  Take over-the-counter or prescription medicines only as told by your doctor.  It is up to you to get the results of your procedure. Ask your doctor, or the department performing the procedure, when your results will be ready. To help cramping and bloating:  Try walking around.  Put heat on your belly (abdomen) as told by your doctor. Use a heat source that your doctor recommends, such as a moist heat pack or a heating pad. ? Put a towel between your skin and the heat source. ? Leave the heat on for 20-30 minutes. ? Remove the heat if your skin turns bright red. This is especially important if you cannot feel pain, heat, or cold. You can get burned. Eating and drinking  Drink enough fluid to  keep your pee (urine) clear or pale yellow.  Return to your normal diet as told by your doctor. Avoid heavy or fried foods that are hard to digest.  Avoid drinking alcohol for as long as told by your doctor. Contact a doctor if:  You have blood in your poop (stool) 2-3 days after the procedure. Get help right away if:  You have more than a small amount of blood in your poop.  You see large  clumps of tissue (blood clots) in your poop.  Your belly is swollen.  You feel sick to your stomach (nauseous).  You throw up (vomit).  You have a fever.  You have belly pain that gets worse, and medicine does not help your pain. This information is not intended to replace advice given to you by your health care provider. Make sure you discuss any questions you have with your health care provider. Document Released: 10/31/2010 Document Revised: 06/22/2016 Document Reviewed: 06/22/2016 Elsevier Interactive Patient Education  2017 [employer]  PATIENT INSTRUCTIONS POST-ANESTHESIA  IMMEDIATELY FOLLOWING SURGERY:  Do not drive or operate machinery for the first twenty four hours after surgery.  Do not make any important decisions for twenty four hours after surgery or while taking narcotic pain medications or sedatives.  If you develop intractable nausea and vomiting or a severe headache please notify your doctor immediately.  FOLLOW-UP:  Please make an appointment with your surgeon as instructed. You do not need to follow up with anesthesia unless specifically instructed to do so.  WOUND CARE INSTRUCTIONS (if applicable):  Keep a dry clean dressing on the anesthesia/puncture wound site if there is drainage.  Once the wound has quit draining you may leave it open to air.  Generally you should leave the bandage intact for twenty four hours unless there is drainage.  If the epidural site drains for more than 36-48 hours please call the anesthesia department.  QUESTIONS?:  Please feel free to call your physician or the hospital operator if you have any questions, and they will be happy to assist you.

## 2018-02-11 NOTE — Op Note (Signed)
[facility] Patient Name: Lynn Fernandez Procedure Date: 02/11/2018 7:47 AM MRN: [id number] Date of Birth: 06-16-1966 Attending MD: Lionel December , MD CSN: [id number] Age: 52 Admit Type: Outpatient Procedure:                Colonoscopy Indications:              Screening for colorectal malignant neoplasm Providers:                Lionel December, MD, Brain Hilts RN, RN, Burke Keels, Technician Referring MD:             Mechele Claude, MD Medicines:                Propofol per Anesthesia Complications:            No immediate complications. Estimated Blood Loss:     Estimated blood loss was minimal. Procedure:                Pre-Anesthesia Assessment:                           - Prior to the procedure, a History and Physical                            was performed, and patient medications and                            allergies were reviewed. The patient's tolerance of                            previous anesthesia was also reviewed. The risks                            and benefits of the procedure and the sedation                            options and risks were discussed with the patient.                            All questions were answered, and informed consent                            was obtained. Prior Anticoagulants: The patient                            last took ibuprofen 3 days prior to the procedure.                            ASA Grade Assessment: II - A patient with mild                            systemic disease. After reviewing the risks and  benefits, the patient was deemed in satisfactory                            condition to undergo the procedure.                           After obtaining informed consent, the colonoscope                            was passed under direct vision. Throughout the                            procedure, the patient's blood pressure, pulse, and      oxygen saturations were monitored continuously. The                            [id number] ([id number]) scope was introduced through                            the anus and advanced to the the cecum, identified                            by appendiceal orifice and ileocecal valve. The                            colonoscopy was somewhat difficult due to                            significant looping. The patient tolerated the                            procedure well. The quality of the bowel                            preparation was excellent. The ileocecal valve,                            appendiceal orifice, and rectum were photographed. Scope In: 7:49:59 AM Scope Out: 8:17:12 AM Scope Withdrawal Time: 0 hours 7 minutes 16 seconds  Total Procedure Duration: 0 hours 27 minutes 13 seconds  Findings:      The perianal and digital rectal examinations were normal.      The proximal rectum, recto-sigmoid colon, sigmoid colon, descending       colon, splenic flexure, transverse colon, hepatic flexure, ascending       colon, cecum, appendiceal orifice and ileocecal valve appeared normal.      A segmental area of friable mucosa with spontaneous bleeding was found       in the distal rectum. The pathology specimen was placed into Bottle       Number 3. Impression:               - The proximal rectum, recto-sigmoid colon, sigmoid                            colon, descending colon, splenic flexure,  transverse colon, hepatic flexure, ascending colon,                            cecum, appendiceal orifice and ileocecal valve are                            normal.                           - Friability erythema with spontaneous bleeding in                            the distal rectum.                           - No specimens collected. Moderate Sedation:      Per Anesthesia Care Recommendation:           - Patient has a contact number available for                             emergencies. The signs and symptoms of potential                            delayed complications were discussed with the                            patient. Return to normal activities tomorrow.                            Written discharge instructions were provided to the                            patient.                           - High fiber diet today.                           - Continue present medications.                           - No aspirin, ibuprofen, naproxen, or other                            non-steroidal anti-inflammatory drugs for 1 day.                           - Await pathology results.                           - Repeat colonoscopy in 10 years for screening                            purposes. Procedure Code(s):        --- Professional ---  16109, Colonoscopy, flexible; diagnostic, including                            collection of specimen(s) by brushing or washing,                            when performed (separate procedure) Diagnosis Code(s):        --- Professional ---                           Z12.11, Encounter for screening for malignant                            neoplasm of colon                           K62.5, Hemorrhage of anus and rectum CPT copyright 2017 American Medical Association. All rights reserved. The codes documented in this report are preliminary and upon coder review may  be revised to meet current compliance requirements. Lionel December, MD Lionel December, MD 02/11/2018 8:31:09 AM This report has been signed electronically. Number of Addenda: 0

## 2018-02-11 NOTE — Op Note (Addendum)
[facility] Patient Name: Lynn Fernandez Procedure Date: 02/11/2018 7:22 AM MRN: [id number] Date of Birth: [birth year] Attending MD: Lionel December , MD CSN: [id number] Age: 52 Admit Type: Outpatient Procedure:                Upper GI endoscopy Indications:              Abnormal CT of the GI tract; thickened gastric wall                            at fundus. Providers:                Lionel December, MD, Criselda Peaches. Patsy Lager, RN, Burke Keels, Technician Referring MD:             Mechele Claude, MD Medicines:                Lidocaine spray, Propofol per Anesthesia Complications:            No immediate complications. Estimated Blood Loss:     Estimated blood loss was minimal. Procedure:                Pre-Anesthesia Assessment:                           - Prior to the procedure, a History and Physical                            was performed, and patient medications and                            allergies were reviewed. The patient's tolerance of                            previous anesthesia was also reviewed. The risks                            and benefits of the procedure and the sedation                            options and risks were discussed with the patient.                            All questions were answered, and informed consent                            was obtained. Prior Anticoagulants: The patient                            last took ibuprofen 3 days prior to the procedure.                            ASA Grade Assessment: II - A patient with mild  systemic disease. After reviewing the risks and                            benefits, the patient was deemed in satisfactory                            condition to undergo the procedure.                           After obtaining informed consent, the endoscope was                            passed under direct vision. Throughout the   procedure, the patient's blood pressure, pulse, and                            oxygen saturations were monitored continuously. The                            [id number] ([id number]) scope was introduced through the                            mouth, and advanced to the second part of duodenum.                            The upper GI endoscopy was accomplished without                            difficulty. The patient tolerated the procedure                            well. Scope In: 7:34:05 AM Scope Out: 7:45:06 AM Total Procedure Duration: 0 hours 11 minutes 1 second  Findings:      The examined esophagus was normal.      The Z-line was irregular and was found 37 cm from the incisors.      Multiple 2 to 6 mm sessile polyps with no stigmata of recent bleeding       were found in the gastric fundus and in the gastric body. Biopsies were       taken with a cold forceps for histology. The pathology specimen was       placed into Bottle Number 1.      The exam of the stomach was otherwise normal.      The esophagus was normal.      The stomach was normal.      The examined duodenum was normal.      Biopsies were taken with a cold forceps in the gastric fundus for       histology.      The duodenal bulb and second portion of the duodenum were normal. Impression:               - Normal esophagus.                           - Z-line irregular, 37 cm from the incisors.                           -  Multiple gastric polyps. Biopsied.                           - Normal esophagus.                           - Normal stomach.                           - Normal examined duodenum.                           - Normal duodenal bulb and second portion of the                            duodenum.                           - Biopsies were taken with a cold forceps for                            histology in the gastric fundus. Moderate Sedation:      Per Anesthesia Care Recommendation:           - Patient has a  contact number available for                            emergencies. The signs and symptoms of potential                            delayed complications were discussed with the                            patient. Return to normal activities tomorrow.                            Written discharge instructions were provided to the                            patient.                           - Resume previous diet and high fiber diet today.                           - Continue present medications.                           - Await pathology results.                           - See the other procedure note for documentation of                            additional recommendations. Procedure Code(s):        --- Professional ---  09811, Esophagogastroduodenoscopy, flexible,                            transoral; with biopsy, single or multiple Diagnosis Code(s):        --- Professional ---                           K22.8, Other specified diseases of esophagus                           K31.7, Polyp of stomach and duodenum                           R93.3, Abnormal findings on diagnostic imaging of                            other parts of digestive tract CPT copyright 2017 American Medical Association. All rights reserved. The codes documented in this report are preliminary and upon coder review may  be revised to meet current compliance requirements. Lionel December, MD Lionel December, MD 02/11/2018 8:26:27 AM This report has been signed electronically. Number of Addenda: 0

## 2018-02-16 ENCOUNTER — Encounter (HOSPITAL_COMMUNITY): Payer: Self-pay | Admitting: Internal Medicine

## 2018-03-23 DIAGNOSIS — Z1231 Encounter for screening mammogram for malignant neoplasm of breast: Secondary | ICD-10-CM | POA: Diagnosis not present

## 2018-03-23 DIAGNOSIS — Z01419 Encounter for gynecological examination (general) (routine) without abnormal findings: Secondary | ICD-10-CM | POA: Diagnosis not present

## 2018-03-23 DIAGNOSIS — Z6824 Body mass index (BMI) 24.0-24.9, adult: Secondary | ICD-10-CM | POA: Diagnosis not present

## 2018-03-23 DIAGNOSIS — Z124 Encounter for screening for malignant neoplasm of cervix: Secondary | ICD-10-CM | POA: Diagnosis not present

## 2018-03-25 ENCOUNTER — Telehealth: Payer: Self-pay | Admitting: Family Medicine

## 2018-03-25 DIAGNOSIS — K2100 Gastro-esophageal reflux disease with esophagitis, without bleeding: Secondary | ICD-10-CM

## 2018-03-25 MED ORDER — PANTOPRAZOLE SODIUM 40 MG PO TBEC: DELAYED_RELEASE_TABLET | ORAL | 0 refills | Status: DC

## 2018-03-25 NOTE — Telephone Encounter (Signed)
Pt is needing a refill on   pantoprazole (PROTONIX) 40 MG tablet   She only has 7 tabs left  You may need to give them the [facility] number [id number]  Please call the pt once this is sent so [facility] can [facility]    [facility]  [phone]

## 2018-03-26 NOTE — Telephone Encounter (Signed)
Patient aware rx sent to pharmacy.  

## 2018-03-30 DIAGNOSIS — N951 Menopausal and female climacteric states: Secondary | ICD-10-CM | POA: Diagnosis not present

## 2018-03-31 ENCOUNTER — Other Ambulatory Visit: Payer: Self-pay | Admitting: *Deleted

## 2018-03-31 DIAGNOSIS — K2100 Gastro-esophageal reflux disease with esophagitis, without bleeding: Secondary | ICD-10-CM

## 2018-03-31 MED ORDER — PANTOPRAZOLE SODIUM 40 MG PO TBEC: DELAYED_RELEASE_TABLET | ORAL | 0 refills | Status: DC

## 2018-04-07 ENCOUNTER — Telehealth: Payer: Self-pay | Admitting: Family Medicine

## 2018-04-07 NOTE — Telephone Encounter (Signed)
Spoke to pharmacy and they couldn't find where they had called. Advised we haven't sent over rx for this pt except for Protonix back on 03/31/18. Pharmacy said to disregard message.

## 2018-06-21 ENCOUNTER — Other Ambulatory Visit: Payer: Self-pay | Admitting: *Deleted

## 2018-06-21 DIAGNOSIS — K2100 Gastro-esophageal reflux disease with esophagitis, without bleeding: Secondary | ICD-10-CM

## 2018-06-21 MED ORDER — PANTOPRAZOLE SODIUM 40 MG PO TBEC: DELAYED_RELEASE_TABLET | ORAL | 0 refills | Status: DC

## 2018-07-12 ENCOUNTER — Other Ambulatory Visit: Payer: Self-pay | Admitting: Family Medicine

## 2018-07-12 DIAGNOSIS — H43391 Other vitreous opacities, right eye: Secondary | ICD-10-CM | POA: Diagnosis not present

## 2018-07-12 DIAGNOSIS — K2100 Gastro-esophageal reflux disease with esophagitis, without bleeding: Secondary | ICD-10-CM

## 2018-07-12 DIAGNOSIS — H3581 Retinal edema: Secondary | ICD-10-CM | POA: Diagnosis not present

## 2018-07-12 DIAGNOSIS — H43811 Vitreous degeneration, right eye: Secondary | ICD-10-CM | POA: Diagnosis not present

## 2018-07-12 DIAGNOSIS — H35371 Puckering of macula, right eye: Secondary | ICD-10-CM | POA: Diagnosis not present

## 2018-07-12 NOTE — Telephone Encounter (Signed)
Patient aware.

## 2018-07-12 NOTE — Telephone Encounter (Signed)
Stacks. NTBS 30 days given 06/21/18

## 2018-08-03 ENCOUNTER — Encounter: Payer: Self-pay | Admitting: Family Medicine

## 2018-08-03 ENCOUNTER — Ambulatory Visit (INDEPENDENT_AMBULATORY_CARE_PROVIDER_SITE_OTHER): Payer: BLUE CROSS/BLUE SHIELD | Admitting: Family Medicine

## 2018-08-03 VITALS — BP 129/89 | HR 80 | Temp 98.4°F | Ht 64.0 in | Wt 134.0 lb

## 2018-08-03 DIAGNOSIS — K2100 Gastro-esophageal reflux disease with esophagitis, without bleeding: Secondary | ICD-10-CM

## 2018-08-03 DIAGNOSIS — E785 Hyperlipidemia, unspecified: Secondary | ICD-10-CM | POA: Diagnosis not present

## 2018-08-03 DIAGNOSIS — K219 Gastro-esophageal reflux disease without esophagitis: Secondary | ICD-10-CM | POA: Diagnosis not present

## 2018-08-03 MED ORDER — PANTOPRAZOLE SODIUM 40 MG PO TBEC: DELAYED_RELEASE_TABLET | ORAL | 3 refills | Status: DC

## 2018-08-03 NOTE — Progress Notes (Signed)
Subjective:  Patient ID: Lynn Fernandez, female    DOB: 1966/05/14  Age: 52 y.o. MRN: [id number]  CC: Medication Refill (Protonix)   HPI Lynn Fernandez presents for patient in for follow-up of GERD. Currently asymptomatic taking  PPI daily. There is no chest pain or heartburn. No hematemesis and no melena. No dysphagia or choking. Onset is remote. Progression is stable. Complicating factors, none.   Depression screen Lynn Fernandez 2/9 08/03/2018 12/02/2016 11/05/2016  Decreased Interest 0 0 0  Down, Depressed, Hopeless 0 0 0  PHQ - 2 Score 0 0 0    History Lynn Fernandez has a past medical history of Allergic rhinitis, Dyslipidemia, GERD (gastroesophageal reflux disease), and Hyperlipidemia.   She has a past surgical history that includes pyloric stenosis; Esophagogastroduodenoscopy (10/05); Esophagogastroduodenoscopy (egd) with propofol (N/A, 02/11/2018); Colonoscopy with propofol (N/A, 02/11/2018); and biopsy (02/11/2018).   Her family history includes Alcohol abuse in her father; Breast cancer in her maternal grandmother; Cancer in her paternal grandmother; Diabetes in her paternal uncle; Heart disease in her father, maternal grandfather, and paternal uncle; Hyperlipidemia in her father; Prostate cancer in her father.She reports that she has never smoked. She has never used smokeless tobacco. She reports that she drinks about 3.0 standard drinks of alcohol per week. She reports that she does not use drugs.    ROS Review of Systems  Constitutional: Negative.   HENT: Negative for congestion.   Eyes: Negative for visual disturbance.  Respiratory: Negative for shortness of breath.   Cardiovascular: Negative for chest pain.  Gastrointestinal: Negative for abdominal pain, constipation, diarrhea, nausea and vomiting.  Genitourinary: Negative for difficulty urinating.  Musculoskeletal: Negative for arthralgias and myalgias.  Neurological: Negative for headaches.  Psychiatric/Behavioral: Negative for  sleep disturbance.    Objective:  BP 129/89   Pulse 80   Temp 98.4 F (36.9 C) (Oral)   Ht 5\' 4"  (1.626 m)   Wt 134 lb (60.8 kg)   BMI 23.00 kg/m   BP Readings from Last 3 Encounters:  08/03/18 129/89  02/11/18 (!) 142/77  02/07/18 (!) 142/88    Wt Readings from Last 3 Encounters:  08/03/18 134 lb (60.8 kg)  02/11/18 140 lb (63.5 kg)  02/07/18 140 lb (63.5 kg)     Physical Exam  Constitutional: She is oriented to person, place, and time. She appears well-developed and well-nourished. No distress.  HENT:  Head: Normocephalic and atraumatic.  Right Ear: External ear normal.  Left Ear: External ear normal.  Nose: Nose normal.  Mouth/Throat: Oropharynx is clear and moist.  Eyes: Pupils are equal, round, and reactive to light. Conjunctivae and EOM are normal.  Neck: Normal range of motion. Neck supple. No thyromegaly present.  Cardiovascular: Normal rate, regular rhythm and normal heart sounds.  No murmur heard. Pulmonary/Chest: Effort normal and breath sounds normal. No respiratory distress. She has no wheezes. She has no rales.  Abdominal: Soft. Bowel sounds are normal. She exhibits no distension. There is no tenderness.  Lymphadenopathy:    She has no cervical adenopathy.  Neurological: She is alert and oriented to person, place, and time. She has normal reflexes.  Skin: Skin is warm and dry.  Psychiatric: She has a normal mood and affect. Her behavior is normal. Judgment and thought content normal.      Assessment & Plan:   Lynn Fernandez was seen today for medication refill.  Diagnoses and all orders for this visit:  Gastroesophageal reflux disease without esophagitis  Hyperlipidemia with target LDL less than 100  I am having Lynn Fernandez. Lynn Fernandez maintain her drospirenone-ethinyl estradiol, ASCORBIC ACID PO, Acetaminophen-Caff-Pyrilamine (MIDOL COMPLETE PO), Magnesium, Multiple Vitamins-Minerals (MULTIVITAMIN ADULT PO), Cetirizine-Pseudoephedrine (ZYRTEC-D  PO), BENEFIBER, polyethylene glycol, ibuprofen, Calcium Carbonate-Vitamin D (CALCIUM 500 + D PO), Cholecalciferol, Black Cohosh, naproxen sodium, Propylene Glycol, B Complex-C-Folic Acid (STRESS B COMPLEX PO), and pantoprazole.  Allergies as of 08/03/2018      Reactions   Latex Other (See Comments)   Burning sensation    Penicillins Other (See Comments)   Patient states that it was high doses. She is not exactly sure of reaction. Has patient had a PCN reaction causing immediate rash, facial/tongue/throat swelling, SOB or lightheadedness with hypotension: Unknown Has patient had a PCN reaction causing severe rash involving mucus membranes or skin necrosis: Unknown Has patient had a PCN reaction that required hospitalization: Unknown Has patient had a PCN reaction occurring within the last 10 years: No If all of the above answers are "NO", then may pr      Medication List        Accurate as of 08/03/18  4:46 PM. Always use your most recent med list.          ASCORBIC ACID PO Take 1,000 mg by mouth 2 (two) times a week.   BENEFIBER Powd Take 1 each by mouth See admin instructions. Take 1 tablespoon by mouth twice daily   Black Cohosh 540 MG Caps Take 540 mg by mouth at bedtime.   CALCIUM 500 + D PO Take 1 tablet by mouth every 7 (seven) days.   Cholecalciferol 2000 units Caps Take 2,000 Units by mouth every 7 (seven) days.   drospirenone-ethinyl estradiol 3-0.02 MG tablet Commonly known as:  YAZ,GIANVI,LORYNA Take 1 tablet by mouth daily.   ibuprofen 200 MG tablet Commonly known as:  ADVIL,MOTRIN Take 400 mg by mouth every 8 (eight) hours as needed for headache.   Magnesium 250 MG Tabs Take 500 mg by mouth daily.   MIDOL COMPLETE PO Take 2 tablets by mouth 2 (two) times daily as needed (menstrual cramps).   MULTIVITAMIN ADULT PO Take 1 tablet by mouth daily.   naproxen sodium 220 MG tablet Commonly known as:  ALEVE Take 220 mg by mouth daily as needed (for  arthritis pain).   pantoprazole 40 MG tablet Commonly known as:  PROTONIX TAKE 1 TABLET DAILY (Needs to be seen)   polyethylene glycol packet Commonly known as:  MIRALAX / GLYCOLAX Take 17 g by mouth daily.   STRESS B COMPLEX PO Take 1 tablet by mouth daily.   SYSTANE BALANCE 0.6 % Soln Generic drug:  Propylene Glycol Place 2 drops into both eyes 2 (two) times daily. Systane [employer] Eye drops   ZYRTEC-D PO Take 1 tablet by mouth daily.        Follow-up: Return in about 1 year (around 08/04/2019).  Mechele Claude, M.D.

## 2018-10-18 DIAGNOSIS — H43391 Other vitreous opacities, right eye: Secondary | ICD-10-CM | POA: Diagnosis not present

## 2018-10-18 DIAGNOSIS — H35371 Puckering of macula, right eye: Secondary | ICD-10-CM | POA: Diagnosis not present

## 2018-10-18 DIAGNOSIS — H43811 Vitreous degeneration, right eye: Secondary | ICD-10-CM | POA: Diagnosis not present

## 2018-10-18 DIAGNOSIS — H3581 Retinal edema: Secondary | ICD-10-CM | POA: Diagnosis not present

## 2018-11-15 ENCOUNTER — Encounter: Payer: Self-pay | Admitting: Family Medicine

## 2018-11-22 ENCOUNTER — Encounter: Payer: Self-pay | Admitting: Family Medicine

## 2019-03-20 DIAGNOSIS — Y999 Unspecified external cause status: Secondary | ICD-10-CM | POA: Diagnosis not present

## 2019-03-20 DIAGNOSIS — Y93K9 Activity, other involving animal care: Secondary | ICD-10-CM | POA: Diagnosis not present

## 2019-03-20 DIAGNOSIS — Y929 Unspecified place or not applicable: Secondary | ICD-10-CM | POA: Diagnosis not present

## 2019-03-20 DIAGNOSIS — W540XXA Bitten by dog, initial encounter: Secondary | ICD-10-CM | POA: Diagnosis not present

## 2019-03-20 DIAGNOSIS — S0125XA Open bite of nose, initial encounter: Secondary | ICD-10-CM | POA: Diagnosis not present

## 2019-03-21 DIAGNOSIS — W540XXA Bitten by dog, initial encounter: Secondary | ICD-10-CM | POA: Diagnosis not present

## 2019-03-21 DIAGNOSIS — S0121XA Laceration without foreign body of nose, initial encounter: Secondary | ICD-10-CM | POA: Diagnosis not present

## 2019-03-21 DIAGNOSIS — S0125XA Open bite of nose, initial encounter: Secondary | ICD-10-CM | POA: Diagnosis not present

## 2019-03-21 DIAGNOSIS — K219 Gastro-esophageal reflux disease without esophagitis: Secondary | ICD-10-CM | POA: Diagnosis not present

## 2019-04-18 DIAGNOSIS — H35371 Puckering of macula, right eye: Secondary | ICD-10-CM | POA: Diagnosis not present

## 2019-04-18 DIAGNOSIS — H43391 Other vitreous opacities, right eye: Secondary | ICD-10-CM | POA: Diagnosis not present

## 2019-04-18 DIAGNOSIS — H3581 Retinal edema: Secondary | ICD-10-CM | POA: Diagnosis not present

## 2019-04-18 DIAGNOSIS — H43811 Vitreous degeneration, right eye: Secondary | ICD-10-CM | POA: Diagnosis not present

## 2019-04-25 DIAGNOSIS — W540XXA Bitten by dog, initial encounter: Secondary | ICD-10-CM | POA: Diagnosis not present

## 2019-04-25 DIAGNOSIS — K219 Gastro-esophageal reflux disease without esophagitis: Secondary | ICD-10-CM | POA: Diagnosis not present

## 2019-04-25 DIAGNOSIS — S0121XD Laceration without foreign body of nose, subsequent encounter: Secondary | ICD-10-CM | POA: Diagnosis not present

## 2019-05-23 DIAGNOSIS — Z124 Encounter for screening for malignant neoplasm of cervix: Secondary | ICD-10-CM | POA: Diagnosis not present

## 2019-05-23 DIAGNOSIS — Z1231 Encounter for screening mammogram for malignant neoplasm of breast: Secondary | ICD-10-CM | POA: Diagnosis not present

## 2019-05-23 DIAGNOSIS — Z01419 Encounter for gynecological examination (general) (routine) without abnormal findings: Secondary | ICD-10-CM | POA: Diagnosis not present

## 2019-05-23 DIAGNOSIS — Z6824 Body mass index (BMI) 24.0-24.9, adult: Secondary | ICD-10-CM | POA: Diagnosis not present

## 2019-06-06 DIAGNOSIS — S0121XD Laceration without foreign body of nose, subsequent encounter: Secondary | ICD-10-CM | POA: Diagnosis not present

## 2019-07-20 ENCOUNTER — Other Ambulatory Visit: Payer: Self-pay | Admitting: Family Medicine

## 2019-07-20 DIAGNOSIS — K2100 Gastro-esophageal reflux disease with esophagitis, without bleeding: Secondary | ICD-10-CM

## 2019-07-20 NOTE — Telephone Encounter (Signed)
Left detailed message.   

## 2019-07-20 NOTE — Telephone Encounter (Signed)
Stacks. NTBS LOV 08/03/18. Mail order pharmacy RF not sent

## 2019-07-24 DIAGNOSIS — S0121XA Laceration without foreign body of nose, initial encounter: Secondary | ICD-10-CM | POA: Diagnosis not present

## 2019-07-24 DIAGNOSIS — W540XXA Bitten by dog, initial encounter: Secondary | ICD-10-CM | POA: Diagnosis not present

## 2019-07-24 DIAGNOSIS — K219 Gastro-esophageal reflux disease without esophagitis: Secondary | ICD-10-CM | POA: Diagnosis not present

## 2019-07-31 ENCOUNTER — Encounter: Payer: Self-pay | Admitting: Family Medicine

## 2019-07-31 ENCOUNTER — Ambulatory Visit (INDEPENDENT_AMBULATORY_CARE_PROVIDER_SITE_OTHER): Payer: BC Managed Care – PPO | Admitting: Family Medicine

## 2019-07-31 DIAGNOSIS — K5904 Chronic idiopathic constipation: Secondary | ICD-10-CM | POA: Diagnosis not present

## 2019-07-31 DIAGNOSIS — K219 Gastro-esophageal reflux disease without esophagitis: Secondary | ICD-10-CM

## 2019-07-31 MED ORDER — PANTOPRAZOLE SODIUM 40 MG PO TBEC: 40.0000 mg | DELAYED_RELEASE_TABLET | Freq: Every day | ORAL | 0 refills | Status: DC

## 2019-07-31 MED ORDER — PANTOPRAZOLE SODIUM 40 MG PO TBEC: DELAYED_RELEASE_TABLET | ORAL | 3 refills | Status: DC

## 2019-07-31 NOTE — Progress Notes (Signed)
Subjective:    Patient ID: Lynn Fernandez, female    DOB: 07/25/1966, 53 y.o.   MRN: [id number]   HPI: Lynn Fernandez is a 53 y.o. female presenting for Patient in for follow-up of GERD. Currently asymptomatic taking  PPI daily. There is no chest pain or heartburn. No hematemesis and no melena. No dysphagia or choking. Onset is remote. Progression is stable. Complicating factors, none. Also concerned about low back pain related to childhood injury.Using posture corrector and stretching.    Depression screen [facility] 2/9 08/03/2018 12/02/2016 11/05/2016  Decreased Interest 0 0 0  Down, Depressed, Hopeless 0 0 0  PHQ - 2 Score 0 0 0     Relevant past medical, surgical, family and social history reviewed and updated as indicated.  Interim medical history since our last visit reviewed. Allergies and medications reviewed and updated.  ROS:  Review of Systems  Constitutional: Negative.   HENT: Negative.   Eyes: Negative for visual disturbance.  Respiratory: Negative for shortness of breath.   Cardiovascular: Negative for chest pain.  Gastrointestinal: Positive for constipation. Negative for abdominal pain.  Musculoskeletal: Positive for back pain (Chronic low back pain. - glutes are getting readjusted, but uncomfortable as she works on her posture. ). Negative for arthralgias.     Social History   Tobacco Use  Smoking Status Never Smoker  Smokeless Tobacco Never Used       Objective:     Wt Readings from Last 3 Encounters:  08/03/18 134 lb (60.8 kg)  02/11/18 140 lb (63.5 kg)  02/07/18 140 lb (63.5 kg)     Exam deferred. Lynn Fernandez. Harboring due to COVID 19. Phone visit performed.   Assessment & Plan:   1. Gastroesophageal reflux disease without esophagitis   2. Chronic idiopathic constipation   3. Gastroesophageal reflux disease with esophagitis     Meds ordered this encounter  Medications  . pantoprazole (PROTONIX) 40 MG tablet    Sig: TAKE 1 TABLET DAILY  (Needs to be seen)    Dispense:  90 tablet    Refill:  3    Needs to fill today locally. Then use [facility] for the next several refills starting in two weeks  . pantoprazole (PROTONIX) 40 MG tablet    Sig: Take 1 tablet (40 mg total) by mouth daily. For stomach    Dispense:  30 tablet    Refill:  0    No orders of the defined types were placed in this encounter.     Diagnoses and all orders for this visit:  Gastroesophageal reflux disease without esophagitis  Chronic idiopathic constipation  Gastroesophageal reflux disease with esophagitis -     pantoprazole (PROTONIX) 40 MG tablet; TAKE 1 TABLET DAILY (Needs to be seen)  Other orders -     pantoprazole (PROTONIX) 40 MG tablet; Take 1 tablet (40 mg total) by mouth daily. For stomach    Virtual Visit via telephone Note  I discussed the limitations, risks, security and privacy concerns of performing an evaluation and management service by telephone and the availability of in person appointments. The patient was identified with two identifiers. Lynn Fernandez and agreed to proceed. Lynn Fernandez. Is at home. Dr. Livia Snellen is in his office.  Follow Up Instructions:   I discussed the assessment and treatment plan with the patient. The patient was provided an opportunity to ask questions and all were answered. The patient agreed with the plan and demonstrated an Fernandez of the instructions.   The  patient was advised to call back or seek an in-person evaluation if the symptoms worsen or if the condition fails to improve as anticipated.   Total minutes including chart review and phone contact time: 18   Follow up plan: Return in about 1 year (around 07/30/2020).  Mechele Claude, MD [place] [facility]

## 2019-08-18 ENCOUNTER — Encounter: Payer: Self-pay | Admitting: Family Medicine

## 2019-08-18 ENCOUNTER — Ambulatory Visit (INDEPENDENT_AMBULATORY_CARE_PROVIDER_SITE_OTHER): Payer: BC Managed Care – PPO | Admitting: Family Medicine

## 2019-08-18 ENCOUNTER — Other Ambulatory Visit: Payer: Self-pay

## 2019-08-18 DIAGNOSIS — B9689 Other specified bacterial agents as the cause of diseases classified elsewhere: Secondary | ICD-10-CM | POA: Diagnosis not present

## 2019-08-18 DIAGNOSIS — J019 Acute sinusitis, unspecified: Secondary | ICD-10-CM

## 2019-08-18 DIAGNOSIS — Z7189 Other specified counseling: Secondary | ICD-10-CM

## 2019-08-18 MED ORDER — AZITHROMYCIN 250 MG PO TABS: ORAL_TABLET | ORAL | 0 refills | Status: DC

## 2019-08-18 NOTE — Patient Instructions (Signed)
- Get plenty of rest and drink plenty of fluids. - Try to breathe moist air. Use a cold mist humidifier. - Consume warm fluids (soup or tea) to provide relief for a stuffy nose and to loosen phlegm. - For nasal stuffiness, try saline nasal spray or a Neti Pot. Afrin nasal spray can also be used but this product should not be used longer than 3 days or it will cause rebound nasal stuffiness (worsening nasal congestion). - For sore throat pain relief: use chloraseptic spray, suck on throat lozenges, hard candy or popsicles; gargle with warm salt water (1/4 tsp. salt per 8 oz. of water); and eat soft, bland foods. - Eat a well-balanced diet. If you cannot, ensure you are getting enough nutrients by taking a daily multivitamin. - Avoid dairy products, as they can thicken phlegm. - Avoid alcohol, as it impairs your body's immune system.  CONTACT YOUR DOCTOR IF YOU EXPERIENCE ANY OF THE FOLLOWING: - High fever - Ear pain - Sinus-type headache - Unusually severe cold symptoms - Cough that gets worse while other cold symptoms improve - Flare up of any chronic lung problem, such as asthma - Your symptoms persist longer than 2 weeks  Prevent the Spread of COVID-19 if You Are Sick If you are sick with COVID-19 or think you might have COVID-19, follow the steps below to help protect other people in your home and community. Stay home except to get medical care.  Stay home. Most people with COVID-19 have mild illness and are able to recover at home without medical care. Do not leave your home, except to get medical care. Do not visit public areas.  Take care of yourself. Get rest and stay hydrated.  Get medical care when needed. Call your doctor before you go to their office for care. But, if you have trouble breathing or other concerning symptoms, call 911 for immediate help.  Avoid public transportation, ride-sharing, or taxis. Separate yourself from other people and pets in your home.  As much as  possible, stay in a specific room and away from other people and pets in your home. Also, you should use a separate bathroom, if available. If you need to be around other people or animals in or outside of the home, wear a cloth face covering. ? See COVID-19 and Animals if you have questions about pets: https://www.cdc.gov/coronavirus/2019-ncov/faq.html#COVID19animals Monitor your symptoms.  Common symptoms of COVID-19 include fever and cough. Trouble breathing is a more serious symptom that means you should get medical attention.  Follow care instructions from your healthcare provider and local health department. Your local health authorities will give instructions on checking your symptoms and reporting information. If you develop emergency warning signs for COVID-19 get medical attention immediately.  Emergency warning signs include*:  Trouble breathing  Persistent pain or pressure in the chest  New confusion or not able to be woken  Bluish lips or face *This list is not all inclusive. Please consult your medical provider for any other symptoms that are severe or concerning to you. Call 911 if you have a medical emergency. If you have a medical emergency and need to call 911, notify the operator that you have or think you might have, COVID-19. If possible, put on a facemask before medical help arrives. Call ahead before visiting your doctor.  Call ahead. Many medical visits for routine care are being postponed or done by phone or telemedicine.  If you have a medical appointment that cannot be postponed, call your   doctor's office. This will help the office protect themselves and other patients. If you are sick, wear a cloth covering over your nose and mouth.  You should wear a cloth face covering over your nose and mouth if you must be around other people or animals, including pets (even at home).  You don't need to wear the cloth face covering if you are alone. If you can't put on a  cloth face covering (because of trouble breathing for example), cover your coughs and sneezes in some other way. Try to stay at least 6 feet away from other people. This will help protect the people around you. Note: During the COVID-19 pandemic, medical grade facemasks are reserved for healthcare workers and some first responders. You may need to make a cloth face covering using a scarf or bandana. Cover your coughs and sneezes.  Cover your mouth and nose with a tissue when you cough or sneeze.  Throw used tissues in a lined trash can.  Immediately wash your hands with soap and water for at least 20 seconds. If soap and water are not available, clean your hands with an alcohol-based hand sanitizer that contains at least 60% alcohol. Clean your hands often.  Wash your hands often with soap and water for at least 20 seconds. This is especially important after blowing your nose, coughing, or sneezing; going to the bathroom; and before eating or preparing food.  Use hand sanitizer if soap and water are not available. Use an alcohol-based hand sanitizer with at least 60% alcohol, covering all surfaces of your hands and rubbing them together until they feel dry.  Soap and water are the best option, especially if your hands are visibly dirty.  Avoid touching your eyes, nose, and mouth with unwashed hands. Avoid sharing personal household items.  Do not share dishes, drinking glasses, cups, eating utensils, towels, or bedding with other people in your home.  Wash these items thoroughly after using them with soap and water or put them in the dishwasher. Clean all "high-touch" surfaces everyday.  Clean and disinfect high-touch surfaces in your "sick room" and bathroom. Let someone else clean and disinfect surfaces in common areas, but not your bedroom and bathroom.  If a caregiver or other person needs to clean and disinfect a sick person's bedroom or bathroom, they should do so on an as-needed  basis. The caregiver/other person should wear a mask and wait as long as possible after the sick person has used the bathroom. High-touch surfaces include phones, remote controls, counters, tabletops, doorknobs, bathroom fixtures, toilets, keyboards, tablets, and bedside tables.  Clean and disinfect areas that may have blood, stool, or body fluids on them.  Use household cleaners and disinfectants. Clean the area or item with soap and water or another detergent if it is dirty. Then use a household disinfectant. ? Be sure to follow the instructions on the label to ensure safe and effective use of the product. Many products recommend keeping the surface wet for several minutes to ensure germs are killed. Many also recommend precautions such as wearing gloves and making sure you have good ventilation during use of the product. ? Most EPA-registered household disinfectants should be effective. How to discontinue home isolation  People with COVID-19 who have stayed home (home isolated) can stop home isolation under the following conditions: ? If you will not have a test to determine if you are still contagious, you can leave home after these three things have happened:  You have had   no fever for at least 72 hours (that is three full days of no fever without the use of medicine that reduces fevers) AND  other symptoms have improved (for example, when your cough or shortness of breath has improved) AND  at least 10 days have passed since your symptoms first appeared. ? If you will be tested to determine if you are still contagious, you can leave home after these three things have happened:  You no longer have a fever (without the use of medicine that reduces fevers) AND  other symptoms have improved (for example, when your cough or shortness of breath has improved) AND  you received two negative tests in a row, 24 hours apart. Your doctor will follow CDC guidelines. In all cases, follow the  guidance of your healthcare provider and local health department. The decision to stop home isolation should be made in consultation with your healthcare provider and state and local health departments. Local decisions depend on local circumstances. cdc.gov/coronavirus 02/12/2019 This information is not intended to replace advice given to you by your health care provider. Make sure you discuss any questions you have with your health care provider. Document Released: 01/24/2019 Document Revised: 02/22/2019 Document Reviewed: 01/24/2019 Elsevier Patient Education  2020 Elsevier Inc.   

## 2019-08-18 NOTE — Progress Notes (Signed)
Telephone visit  Subjective: CC: sinus symptoms PCP: Claretta Fraise, MD [id number] Lynn Fernandez is a 53 y.o. female calls for telephone consult today. Patient provides verbal consent for consult held via phone.  Location of patient: home Location of provider: [facility] Others present for call: none  1. URI  Patient reports onset about 2 weeks ago.  She reports sinus congestion and sinus headaches.  She has been using antihistamines and OTC medications including mucinex with improvement of her headache and sinus congestion.  Denies fever, chills.  She reports chest congestion.  She reports the chest congestion is not quite resolving the symptoms.  Overall, symptoms seem worse but she notes increased stress.  She is sheltering at home and has no sick contacts.  ROS: Per HPI  Allergies  Allergen Reactions  . Latex Other (See Comments)    Burning sensation   . Penicillins Other (See Comments)    Patient states that it was high doses. She is not exactly sure of reaction. Has patient had a PCN reaction causing immediate rash, facial/tongue/throat swelling, SOB or lightheadedness with hypotension: Unknown Has patient had a PCN reaction causing severe rash involving mucus membranes or skin necrosis: Unknown Has patient had a PCN reaction that required hospitalization: Unknown Has patient had a PCN reaction occurring within the last 10 years: No If all of the above answers are "NO", then may pr   Past Medical History:  Diagnosis Date  . Allergic rhinitis   . Dyslipidemia   . GERD (gastroesophageal reflux disease)   . Hyperlipidemia     Current Outpatient Medications:  .  Acetaminophen-Caff-Pyrilamine (MIDOL COMPLETE PO), Take 2 tablets by mouth 2 (two) times daily as needed (menstrual cramps). , Disp: , Rfl:  .  ASCORBIC ACID PO, Take 1,000 mg by mouth 2 (two) times a week. , Disp: , Rfl:  .  B Complex-C-Folic Acid (STRESS B COMPLEX PO), Take 1 tablet by mouth daily., Disp: , Rfl:  .   Black Cohosh 540 MG CAPS, Take 540 mg by mouth at bedtime. , Disp: , Rfl:  .  Calcium Carbonate-Vitamin D (CALCIUM 500 + D PO), Take 1 tablet by mouth every 7 (seven) days., Disp: , Rfl:  .  Cetirizine-Pseudoephedrine (ZYRTEC-D PO), Take 1 tablet by mouth daily. , Disp: , Rfl:  .  Cholecalciferol 2000 units CAPS, Take 2,000 Units by mouth every 7 (seven) days., Disp: , Rfl:  .  drospirenone-ethinyl estradiol (YAZ,GIANVI,LORYNA) 3-0.02 MG tablet, Take 1 tablet by mouth daily., Disp: , Rfl:  .  ibuprofen (ADVIL,MOTRIN) 200 MG tablet, Take 400 mg by mouth every 8 (eight) hours as needed for headache., Disp: , Rfl:  .  Magnesium 250 MG TABS, Take 500 mg by mouth daily. , Disp: , Rfl:  .  Multiple Vitamins-Minerals (MULTIVITAMIN ADULT PO), Take 1 tablet by mouth daily. , Disp: , Rfl:  .  naproxen sodium (ALEVE) 220 MG tablet, Take 220 mg by mouth daily as needed (for arthritis pain). , Disp: , Rfl:  .  pantoprazole (PROTONIX) 40 MG tablet, TAKE 1 TABLET DAILY (Needs to be seen), Disp: 90 tablet, Rfl: 3 .  pantoprazole (PROTONIX) 40 MG tablet, Take 1 tablet (40 mg total) by mouth daily. For stomach, Disp: 30 tablet, Rfl: 0 .  polyethylene glycol (MIRALAX) packet, Take 17 g by mouth daily. (Patient taking differently: Take 8.5 g by mouth every other day. ), Disp: , Rfl: 0 .  Propylene Glycol (SYSTANE BALANCE) 0.6 % SOLN, Place 2 drops into both  eyes 2 (two) times daily. Systane high Performance Eye drops , Disp: , Rfl:  .  Wheat Dextrin (BENEFIBER) POWD, Take 1 each by mouth See admin instructions. Take 1 tablespoon by mouth twice daily, Disp: , Rfl:   Assessment/ Plan: 53 y.o. female   1. Acute bacterial sinusitis Empiric treatment for sinus infection.  Continue current OTC therapies.  We discussed return precautions.  If no significant provement over the weekend I advised that she reconsider COVID-19 testing.  She thinks this plan is very reasonable will contact me on Monday.  Continue hydration.   Follow-up as needed. - azithromycin (ZITHROMAX) 250 MG tablet; Take 2 tablets today, then take 1 tablet daily until gone.  Dispense: 6 tablet; Refill: 0  2. Advice given about COVID-19 virus by telephone Offered COVID19 testing   Start time: 1:40pm End time: 1:48pm  Total time spent on patient care (including telephone call/ virtual visit): 13 minutes   Hulen Skains, DO [facility] [phone]

## 2019-09-29 DIAGNOSIS — S0121XA Laceration without foreign body of nose, initial encounter: Secondary | ICD-10-CM | POA: Diagnosis not present

## 2019-09-29 DIAGNOSIS — K219 Gastro-esophageal reflux disease without esophagitis: Secondary | ICD-10-CM | POA: Diagnosis not present

## 2019-10-24 DIAGNOSIS — H35371 Puckering of macula, right eye: Secondary | ICD-10-CM | POA: Diagnosis not present

## 2019-10-24 DIAGNOSIS — H43391 Other vitreous opacities, right eye: Secondary | ICD-10-CM | POA: Diagnosis not present

## 2019-10-24 DIAGNOSIS — H2513 Age-related nuclear cataract, bilateral: Secondary | ICD-10-CM | POA: Diagnosis not present

## 2019-10-24 DIAGNOSIS — H33311 Horseshoe tear of retina without detachment, right eye: Secondary | ICD-10-CM | POA: Diagnosis not present

## 2019-12-22 DIAGNOSIS — W540XXA Bitten by dog, initial encounter: Secondary | ICD-10-CM | POA: Diagnosis not present

## 2019-12-22 DIAGNOSIS — K219 Gastro-esophageal reflux disease without esophagitis: Secondary | ICD-10-CM | POA: Diagnosis not present

## 2019-12-22 DIAGNOSIS — S0121XA Laceration without foreign body of nose, initial encounter: Secondary | ICD-10-CM | POA: Diagnosis not present

## 2020-01-02 ENCOUNTER — Ambulatory Visit: Payer: BC Managed Care – PPO | Admitting: Family Medicine

## 2020-01-02 ENCOUNTER — Telehealth: Payer: Self-pay | Admitting: Family Medicine

## 2020-01-02 NOTE — Telephone Encounter (Signed)
Spoke with pt and she states the "bump" is much smaller now than what it was and feels as though it may be going away. Advised pt to keep an eye on it and if it gets bigger or starts to be painful, redness or hot to the touch to call us back to see if we have anything sooner. Pt voiced understanding.

## 2020-01-24 ENCOUNTER — Ambulatory Visit: Payer: BC Managed Care – PPO | Admitting: Family Medicine

## 2020-01-30 ENCOUNTER — Encounter: Payer: Self-pay | Admitting: Family Medicine

## 2020-01-30 ENCOUNTER — Ambulatory Visit (INDEPENDENT_AMBULATORY_CARE_PROVIDER_SITE_OTHER): Payer: BC Managed Care – PPO | Admitting: Family Medicine

## 2020-01-30 ENCOUNTER — Other Ambulatory Visit: Payer: Self-pay

## 2020-01-30 VITALS — BP 132/82 | HR 81 | Temp 97.0°F | Ht 64.0 in | Wt 137.8 lb

## 2020-01-30 DIAGNOSIS — M461 Sacroiliitis, not elsewhere classified: Secondary | ICD-10-CM | POA: Diagnosis not present

## 2020-01-30 DIAGNOSIS — M222X2 Patellofemoral disorders, left knee: Secondary | ICD-10-CM | POA: Diagnosis not present

## 2020-01-30 NOTE — Progress Notes (Signed)
Subjective:  Patient ID: Lynn Fernandez, female    DOB: 03/28/1966  Age: 54 y.o. MRN: [id number]  CC: Back Pain (Lower, Radiates to legs bilateral)   HPI Lynn Fernandez presents for chronic bilateral low back pain.  This is been present for a couple of years or more.  She wants to go back to riding horses.  And she needs some guidance.  The pain is moderate in severity.  It is present overnight and wakes her up several times a night.  It is located in the sacroiliac iliac region and radiates through the sciatic notch region into the posterior thighs bilaterally.  It is an ache rather than a electrical sensation.  It does not go beyond the knees.  There is no numbing associated.  Patient also has pain in the left knee.  There is a remote injury approximately 30 years ago.  She is favored the knee ever since.  2 years ago she and her husband got a new mattress.  He prefers a firm mattress so that is what they got.  However she has been having some struggling with sleep ever since.  She tries to lay on her side but she cannot get comfortable and she has 2 cats one that lays on each side of her and keep her from adjusting her position as needed.  She also sits at a desk that has a pullout for a computer keyboard.  This makes the knee hole of the desk too low for her.  It causes her to assume an awkward position for her work.  She wonders if this should be replaced as well.  Depression screen [facility] 2/9 01/30/2020 08/03/2018 12/02/2016  Decreased Interest 0 0 0  Down, Depressed, Hopeless 0 0 0  PHQ - 2 Score 0 0 0    History Lynn Fernandez has a past medical history of Allergic rhinitis, Dyslipidemia, GERD (gastroesophageal reflux disease), and Hyperlipidemia.   She has a past surgical history that includes pyloric stenosis; Esophagogastroduodenoscopy (10/05); Esophagogastroduodenoscopy (egd) with propofol (N/A, 02/11/2018); Colonoscopy with propofol (N/A, 02/11/2018); and biopsy (02/11/2018).   Her family  history includes Alcohol abuse in her father; Breast cancer in her maternal grandmother; Cancer in her paternal grandmother; Diabetes in her paternal uncle; Heart disease in her father, maternal grandfather, and paternal uncle; Hyperlipidemia in her father; Prostate cancer in her father.She reports that she has never smoked. She has never used smokeless tobacco. She reports current alcohol use of about 3.0 standard drinks of alcohol per week. She reports that she does not use drugs.    ROS Review of Systems  Constitutional: Negative.   HENT: Negative.   Eyes: Negative for visual disturbance.  Respiratory: Negative for shortness of breath.   Cardiovascular: Negative for chest pain.  Gastrointestinal: Negative for abdominal pain.  Musculoskeletal: Positive for arthralgias (left knee) and back pain (see HPI).    Objective:  BP 132/82   Pulse 81   Temp (!) 97 F (36.1 C) (Temporal)   Ht 5\' 4"  (1.626 m)   Wt 137 lb 12.8 oz (62.5 kg)   BMI 23.65 kg/m   BP Readings from Last 3 Encounters:  01/30/20 132/82  08/03/18 129/89  02/11/18 (!) 142/77    Wt Readings from Last 3 Encounters:  01/30/20 137 lb 12.8 oz (62.5 kg)  08/03/18 134 lb (60.8 kg)  02/11/18 140 lb (63.5 kg)     Physical Exam Constitutional:      General: She is not in acute distress.  Appearance: She is well-developed.  Cardiovascular:     Rate and Rhythm: Normal rate and regular rhythm.  Pulmonary:     Breath sounds: Normal breath sounds.  Musculoskeletal:        General: Tenderness (Superior aspect of the sacral. iliac joint bilaterally) present.  Skin:    General: Skin is warm and dry.  Neurological:     Mental Status: She is alert and oriented to person, place, and time.       Assessment & Plan:   Lynn Fernandez was seen today for back pain.  Diagnoses and all orders for this visit:  Sacroiliitis (HCC)  Patellofemoral pain syndrome of left knee -     Ambulatory referral to Physical  Therapy       I have discontinued Silas [place]. Linton-Hall's azithromycin. I am also having her maintain her drospirenone-ethinyl estradiol, ASCORBIC ACID PO, Acetaminophen-Caff-Pyrilamine (MIDOL COMPLETE PO), Magnesium, Multiple Vitamins-Minerals (MULTIVITAMIN ADULT PO), Cetirizine-Pseudoephedrine (ZYRTEC-D PO), Benefiber, polyethylene glycol, ibuprofen, Calcium Carbonate-Vitamin D (CALCIUM 500 + D PO), Cholecalciferol, Black Cohosh, naproxen sodium, Propylene Glycol, B Complex-C-Folic Acid (STRESS B COMPLEX PO), pantoprazole, and pantoprazole.  Allergies as of 01/30/2020      Reactions   Latex Other (See Comments)   Burning sensation    Penicillins Other (See Comments)   Patient states that it was high doses. She is not exactly sure of reaction. Has patient had a PCN reaction causing immediate rash, facial/tongue/throat swelling, SOB or lightheadedness with hypotension: Unknown Has patient had a PCN reaction causing severe rash involving mucus membranes or skin necrosis: Unknown Has patient had a PCN reaction that required hospitalization: Unknown Has patient had a PCN reaction occurring within the last 10 years: No If all of the above answers are "NO", then may pr      Medication List       Accurate as of January 30, 2020 11:30 PM. If you have any questions, ask your nurse or doctor.        STOP taking these medications   azithromycin 250 MG tablet Commonly known as: ZITHROMAX Stopped by: Mechele Claude, MD     TAKE these medications   ASCORBIC ACID PO Take 1,000 mg by mouth 2 (two) times a week.   Benefiber Powd Take 1 each by mouth See admin instructions. Take 1 tablespoon by mouth twice daily   Black Cohosh 540 MG Caps Take 540 mg by mouth at bedtime.   CALCIUM 500 + D PO Take 1 tablet by mouth every 7 (seven) days.   Cholecalciferol 50 MCG (2000 UT) Caps Take 2,000 Units by mouth every 7 (seven) days.   drospirenone-ethinyl estradiol 3-0.02 MG tablet Commonly known  as: YAZ Take 1 tablet by mouth daily.   ibuprofen 200 MG tablet Commonly known as: ADVIL Take 400 mg by mouth every 8 (eight) hours as needed for headache.   Magnesium 250 MG Tabs Take 500 mg by mouth daily.   MIDOL COMPLETE PO Take 2 tablets by mouth 2 (two) times daily as needed (menstrual cramps).   MULTIVITAMIN ADULT PO Take 1 tablet by mouth daily.   naproxen sodium 220 MG tablet Commonly known as: ALEVE Take 220 mg by mouth daily as needed (for arthritis pain).   pantoprazole 40 MG tablet Commonly known as: PROTONIX Take 1 tablet (40 mg total) by mouth daily. For stomach   pantoprazole 40 MG tablet Commonly known as: PROTONIX TAKE 1 TABLET DAILY (Needs to be seen)   polyethylene glycol 17 g packet Commonly known  as: MiraLax Take 17 g by mouth daily. What changed:   how much to take  when to take this   STRESS B COMPLEX PO Take 1 tablet by mouth daily.   Systane Balance 0.6 % Soln Generic drug: Propylene Glycol Place 2 drops into both eyes 2 (two) times daily. Systane high Performance Eye drops   ZYRTEC-D PO Take 1 tablet by mouth daily.       I discussed with the patient at length the various aspects of her condition and that she could try medication, however, it appears that from conversation she would benefit from a change in mattress admit different desk and low back exercises for the sacroiliitis.  Additionally physical therapy is going to be her best course of action for patellofemoral syndrome. Follow-up: Return if symptoms worsen or fail to improve.  Claretta Fraise, M.D.

## 2020-02-20 ENCOUNTER — Encounter: Payer: Self-pay | Admitting: Family Medicine

## 2020-02-20 DIAGNOSIS — M25521 Pain in right elbow: Secondary | ICD-10-CM

## 2020-02-20 DIAGNOSIS — M25562 Pain in left knee: Secondary | ICD-10-CM

## 2020-02-27 ENCOUNTER — Ambulatory Visit: Payer: BC Managed Care – PPO | Attending: Family Medicine | Admitting: Physical Therapy

## 2020-02-27 ENCOUNTER — Other Ambulatory Visit: Payer: Self-pay

## 2020-02-27 ENCOUNTER — Encounter: Payer: Self-pay | Admitting: Physical Therapy

## 2020-02-27 DIAGNOSIS — M25521 Pain in right elbow: Secondary | ICD-10-CM | POA: Diagnosis not present

## 2020-02-27 NOTE — Therapy (Signed)
[facility] [place], [place], [address] Phone: [phone]   Fax:  [phone]  Physical Therapy Evaluation  Patient Details  Name: Lynn Fernandez MRN: [id number] Date of Birth: October 10, 1966 Referring Provider (PT): Claretta Fraise MD   Encounter Date: 02/27/2020  PT End of Session - 02/27/20 1719    Visit Number  1    Number of Visits  8    Date for PT Re-Evaluation  03/26/20    PT Start Time  0315    PT Stop Time  0408    PT Time Calculation (min)  53 min    Activity Tolerance  Patient tolerated treatment well    Behavior During Therapy  [facility] for tasks assessed/performed       Past Medical History:  Diagnosis Date  . Allergic rhinitis   . Dyslipidemia   . GERD (gastroesophageal reflux disease)   . Hyperlipidemia     Past Surgical History:  Procedure Laterality Date  . BIOPSY  02/11/2018   Procedure: BIOPSY;  Surgeon: Rogene Houston, MD;  [facility];  Service: Endoscopy;;  gastric;  colon  . COLONOSCOPY WITH PROPOFOL N/A 02/11/2018   Procedure: COLONOSCOPY WITH PROPOFOL;  Surgeon: Rogene Houston, MD;  [facility];  Service: Endoscopy;  Laterality: N/A;  . ESOPHAGOGASTRODUODENOSCOPY  10/05  . ESOPHAGOGASTRODUODENOSCOPY (EGD) WITH PROPOFOL N/A 02/11/2018   Procedure: ESOPHAGOGASTRODUODENOSCOPY (EGD) WITH PROPOFOL;  Surgeon: Rogene Houston, MD;  [facility];  Service: Endoscopy;  Laterality: N/A;  730  . pyloric stenosis      There were no vitals filed for this visit.   Subjective Assessment - 02/27/20 1725    Subjective  COVID-19 screen performed prior to patient entering clinic.  The patient presents to the clinic today with a CC of right elbow pain.  She has had multiple horse related injuries over the years.  She has had both massage therapy and chiropratic care.  She spends a lot of time at a computer and feels this is contributing to her right elbow pain.  Her pain is  quite low today though she reports she has a high pain tolerance.  Her right elbow pain increases a great deal after her elbow is  at rest and then moving it again.    Pertinent History  H/o LB and left knee pain (knee doing pretty good right now).    Patient Stated Goals  Get rid of pain.    Currently in Pain?  Yes    Pain Score  2     Pain Location  Elbow    Pain Orientation  Right    Pain Descriptors / Indicators  Aching;Sore    Pain Type  Chronic pain    Pain Onset  More than a month ago    Pain Frequency  Intermittent    Aggravating Factors   See above.    Pain Relieving Factors  Varies.         [facility] PT Assessment - 02/27/20 0001      Assessment   Medical Diagnosis  Right elbow pain.    Referring Provider (PT)  Claretta Fraise MD    Onset Date/Surgical Date  --   Many years.     Restrictions   Weight Bearing Restrictions  No      Balance Screen   Has the patient fallen in the past 6 months  No    Has the patient had a decrease in activity  level because of a fear of falling?   No    Is the patient reluctant to leave their home because of a fear of falling?   No      Home Environment   Living Environment  Private residence      Prior Function   Level of Independence  Independent      ROM / Strength   AROM / PROM / Strength  AROM;Strength      AROM   Overall AROM Comments  Full right wrist AROM.      Strength   Overall Strength Comments  Normal right wrist strength.      Palpation   Palpation comment  C/o pain at right lateral epicondylar region.      Special Tests   Other special tests  Right Biceps DTR less brisk than left.                  Objective measurements completed on examination: See above findings.      [facility] Adult PT Treatment/Exercise - 02/27/20 0001      Modalities   Modalities  Electrical Stimulation      Electrical Stimulation   Electrical Stimulation Location  Right lateral epicondylar region.    Electrical Stimulation  Action  Pre-mod.    Electrical Stimulation Parameters  80-150 Hz x 20 minutes.    Electrical Stimulation Goals  Pain                  PT Long Term Goals - 02/27/20 1745      PT LONG TERM GOAL #1   Title  Independent with a HEP.    Time  4    Period  Weeks    Status  New      PT LONG TERM GOAL #2   Title  Average daily right elbow pain level not exceeding a 2-3/10.    Time  4    Period  Weeks    Status  New             Plan - 02/27/20 1735    Clinical Impression Statement  The patient presenst to OPPT wiht c/o right elbow pain.  She reports pain over her right lateral epicondylar region.  She has full range of motion and normal right wrist strength.  Her right Bicep DTR is less brisk than left.  Patient will benefit from skilled physical therapy intervention to address pain.    Personal Factors and Comorbidities  Comorbidity 1    Comorbidities  H/o LB and left knee pain (knee doing pretty good right now).    Examination-Activity Limitations  Other    Examination-Participation Restrictions  Other    Stability/Clinical Decision Making  Evolving/Moderate complexity    Clinical Decision Making  Low    Rehab Potential  Good    PT Frequency  2x / week    PT Duration  4 weeks    PT Treatment/Interventions  ADLs/Self Care Home Management;Cryotherapy;Electrical Stimulation;Ultrasound;Moist Heat;Iontophoresis 4mg /ml Dexamethasone;Therapeutic activities;Therapeutic exercise;Manual techniques;Patient/family education;Passive range of motion;Dry needling;Joint Manipulations;Vasopneumatic Device    PT Next Visit Plan  Pulsed U/S to right lateral elbow, STW/M/IASTM, resisted right wrist extension, Ionto.    Consulted and Agree with Plan of Care  Patient       Patient will benefit from skilled therapeutic intervention in order to improve the following deficits and impairments:  Pain, Decreased activity tolerance  Visit Diagnosis: Pain in right elbow - Plan: PT plan of care  cert/re-cert  Problem List Patient Active Problem List   Diagnosis Date Noted  . Gastric wall thickening 01/20/2018  . Abnormal CT of the abdomen 11/30/2017  . Constipation 11/30/2017  . Special screening for malignant neoplasms, colon 11/30/2017  . GERD (gastroesophageal reflux disease) 03/31/2012  . Allergic rhinitis, seasonal 03/31/2012  . Hyperlipidemia with target LDL less than 100 03/31/2012    [place], [place] MPT 02/27/2020, 5:47 PM  [facility] [address] [place], [place], [address] Phone: [phone]   Fax:  [phone]  Name: Lynn Fernandez MRN: [id number] Date of Birth: [birth year]

## 2020-03-04 ENCOUNTER — Ambulatory Visit: Payer: BC Managed Care – PPO | Admitting: Physical Therapy

## 2020-03-04 ENCOUNTER — Other Ambulatory Visit: Payer: Self-pay

## 2020-03-04 DIAGNOSIS — M25521 Pain in right elbow: Secondary | ICD-10-CM | POA: Diagnosis not present

## 2020-03-04 NOTE — Therapy (Signed)
[facility] [address] [place], [place], [address] Phone: [phone]   Fax:  [phone]  Physical Therapy Treatment  Patient Details  Name: BRINLEIGH TEW MRN: [id number] Date of Birth: [birth year] Referring Provider (PT): Mechele Claude MD   Encounter Date: 03/04/2020  PT End of Session - 03/04/20 1428    Visit Number  2    Number of Visits  8    Date for PT Re-Evaluation  03/26/20    PT Start Time  0150    PT Stop Time  0228    PT Time Calculation (min)  38 min    Activity Tolerance  Patient tolerated treatment well    Behavior During Therapy  [facility] for tasks assessed/performed       Past Medical History:  Diagnosis Date  . Allergic rhinitis   . Dyslipidemia   . GERD (gastroesophageal reflux disease)   . Hyperlipidemia     Past Surgical History:  Procedure Laterality Date  . BIOPSY  02/11/2018   Procedure: BIOPSY;  Surgeon: Malissa Hippo, MD;  [facility];  Service: Endoscopy;;  gastric;  colon  . COLONOSCOPY WITH PROPOFOL N/A 02/11/2018   Procedure: COLONOSCOPY WITH PROPOFOL;  Surgeon: Malissa Hippo, MD;  [facility];  Service: Endoscopy;  Laterality: N/A;  . ESOPHAGOGASTRODUODENOSCOPY  10/05  . ESOPHAGOGASTRODUODENOSCOPY (EGD) WITH PROPOFOL N/A 02/11/2018   Procedure: ESOPHAGOGASTRODUODENOSCOPY (EGD) WITH PROPOFOL;  Surgeon: Malissa Hippo, MD;  [facility];  Service: Endoscopy;  Laterality: N/A;  730  . pyloric stenosis      There were no vitals filed for this visit.  Subjective Assessment - 03/04/20 1418    Subjective  COVID-19 screen performed prior to patient entering clinic.  Felt good after treatment.  Didn't really notice until I pulled weeds.    Pertinent History  H/o LB and left knee pain (knee doing pretty good right now).    Patient Stated Goals  Get rid of pain.    Currently in Pain?  Yes    Pain Score  2     Pain Location  Elbow    Pain Orientation  Right    Pain Descriptors / Indicators  Aching;Sore    Pain Onset  More than a month ago                        [facility] Adult PT Treatment/Exercise - 03/04/20 0001      Modalities   Modalities  Electrical Stimulation;Iontophoresis;Ultrasound      Programme researcher, broadcasting/film/video Location  RT lateral epicondylar region.    Electrical Stimulation Action  Pre-mod.    Electrical Stimulation Parameters  80-150 Hz x 10 minutes.    Electrical Stimulation Goals  Pain      Ultrasound   Ultrasound Location  Right lateral epicondylar region.    Ultrasound Parameters  Combo e'stim/U/S at 1.20 W/CM2 at 3.3 mHz at 20% x 8 minutes.      Iontophoresis   Type of Iontophoresis  Dexamethasone    Location  Right lateral epicondyle    Dose  80 mA-Min.    Time  --   8.     Manual Therapy   Manual Therapy  Soft tissue mobilization    Soft tissue mobilization  IASTM to right wrist extensor musculature near lateral epicondyle x 7 minutes.  PT Long Term Goals - 02/27/20 1745      PT LONG TERM GOAL #1   Title  Independent with a HEP.    Time  4    Period  Weeks    Status  New      PT LONG TERM GOAL #2   Title  Average daily right elbow pain level not exceeding a 2-3/10.    Time  4    Period  Weeks    Status  New            Plan - 03/04/20 1428    Clinical Impression Statement  Patient had a good response to her first treatment.  Palpable pain over right lateral epicondyle.    Personal Factors and Comorbidities  Comorbidity 1    Comorbidities  H/o LB and left knee pain (knee doing pretty good right now).    Examination-Activity Limitations  Other    Stability/Clinical Decision Making  Evolving/Moderate complexity    Rehab Potential  Good    PT Frequency  2x / week    PT Duration  4 weeks    PT Treatment/Interventions  ADLs/Self Care Home Management;Cryotherapy;Electrical Stimulation;Ultrasound;Moist Heat;Iontophoresis 4mg /ml  Dexamethasone;Therapeutic activities;Therapeutic exercise;Manual techniques;Patient/family education;Passive range of motion;Dry needling;Joint Manipulations;Vasopneumatic Device    PT Next Visit Plan  Pulsed U/S to right lateral elbow, STW/M/IASTM, resisted right wrist extension, Ionto.    Consulted and Agree with Plan of Care  Patient       Patient will benefit from skilled therapeutic intervention in order to improve the following deficits and impairments:     Visit Diagnosis: Pain in right elbow     Problem List Patient Active Problem List   Diagnosis Date Noted  . Gastric wall thickening 01/20/2018  . Abnormal CT of the abdomen 11/30/2017  . Constipation 11/30/2017  . Special screening for malignant neoplasms, colon 11/30/2017  . GERD (gastroesophageal reflux disease) 03/31/2012  . Allergic rhinitis, seasonal 03/31/2012  . Hyperlipidemia with target LDL less than 100 03/31/2012    Lulie Hurd, [place] MPT 03/04/2020, 2:30 PM  [facility] [address] [place], [place], [address] Phone: [phone]   Fax:  [phone]  Name: RYLYN ZAWISTOWSKI MRN: [id number] Date of Birth: Dec 15, 1965

## 2020-03-12 ENCOUNTER — Ambulatory Visit: Payer: BC Managed Care – PPO | Attending: Family Medicine | Admitting: Physical Therapy

## 2020-03-12 ENCOUNTER — Other Ambulatory Visit: Payer: Self-pay

## 2020-03-12 DIAGNOSIS — M25521 Pain in right elbow: Secondary | ICD-10-CM

## 2020-03-12 NOTE — Therapy (Signed)
[facility] [address] [place], [place], [address] Phone: [phone]   Fax:  [phone]  Physical Therapy Treatment  Patient Details  Name: Lynn Fernandez MRN: [id number] Date of Birth: October 08, 1966 Referring Provider (PT): Mechele Claude MD   Encounter Date: 03/12/2020  PT End of Session - 03/12/20 1703    Visit Number  3    Number of Visits  8    Date for PT Re-Evaluation  03/26/20    PT Start Time  0406    PT Stop Time  0456    PT Time Calculation (min)  50 min    Activity Tolerance  Patient tolerated treatment well    Behavior During Therapy  [facility] for tasks assessed/performed       Past Medical History:  Diagnosis Date  . Allergic rhinitis   . Dyslipidemia   . GERD (gastroesophageal reflux disease)   . Hyperlipidemia     Past Surgical History:  Procedure Laterality Date  . BIOPSY  02/11/2018   Procedure: BIOPSY;  Surgeon: Malissa Hippo, MD;  [facility];  Service: Endoscopy;;  gastric;  colon  . COLONOSCOPY WITH PROPOFOL N/A 02/11/2018   Procedure: COLONOSCOPY WITH PROPOFOL;  Surgeon: Malissa Hippo, MD;  [facility];  Service: Endoscopy;  Laterality: N/A;  . ESOPHAGOGASTRODUODENOSCOPY  10/05  . ESOPHAGOGASTRODUODENOSCOPY (EGD) WITH PROPOFOL N/A 02/11/2018   Procedure: ESOPHAGOGASTRODUODENOSCOPY (EGD) WITH PROPOFOL;  Surgeon: Malissa Hippo, MD;  [facility];  Service: Endoscopy;  Laterality: N/A;  730  . pyloric stenosis      There were no vitals filed for this visit.  Subjective Assessment - 03/12/20 1702    Subjective  COVID-19 screen performed prior to patient entering clinic.  Active weekend but without major c/o right elbow Fernandez.    Pertinent History  H/o LB and left knee Fernandez (knee doing pretty good right now).    Patient Stated Goals  Get rid of Fernandez.    Currently in Fernandez?  Yes    Fernandez Score  2     Fernandez Location  Elbow    Fernandez Orientation  Right    Fernandez Descriptors  / Indicators  Aching    Fernandez Type  Chronic Fernandez    Fernandez Onset  More than a month ago                        [facility] Adult PT Treatment/Exercise - 03/12/20 0001      Modalities   Modalities  Electrical Stimulation;Iontophoresis;Moist Heat;Ultrasound      Moist Heat Therapy   Number Minutes Moist Heat  15 Minutes    Moist Heat Location  --   RT elbow.     Programme researcher, broadcasting/film/video Location  RT lateral epicondylar area.    Electrical Stimulation Action  Pre-mod.    Electrical Stimulation Parameters  80-150 Hz x 15 minutes.    Electrical Stimulation Goals  Fernandez      Ultrasound   Ultrasound Location  RT lateral epicondylar region.    Ultrasound Parameters  U/S at 3.3 mHz at 20% x 8 minutes.      Iontophoresis   Type of Iontophoresis  Dexamethasone    Location  RT lateral epicondyle.   RT lateral epicondyle.   Dose  80 mA-Min.    Time  --   8.     Manual Therapy   Manual Therapy  Soft tissue mobilization  Soft tissue mobilization  IASTM to right lateral epicondylar region and proximal 1/3 of Lynn right wrist extensor musculature x 8 minutes.                  PT Long Term Goals - 02/27/20 1745      PT LONG TERM GOAL #1   Title  Independent with a HEP.    Time  4    Period  Weeks    Status  New      PT LONG TERM GOAL #2   Title  Average daily right elbow Fernandez level not exceeding a 2-3/10.    Time  4    Period  Weeks    Status  New            Plan - 03/12/20 1708    Clinical Impression Statement  Patient had a very active weekend and it appears Lynn right elbow did well.  There was less palpable Fernandez over Lynn right lateral epicondyle today.    Personal Factors and Comorbidities  Comorbidity 1    Comorbidities  H/o LB and left knee Fernandez (knee doing pretty good right now).    Examination-Activity Limitations  Other    Examination-Participation Restrictions  Other    Stability/Clinical Decision Making   Evolving/Moderate complexity    Rehab Potential  Good    PT Frequency  2x / week    PT Duration  4 weeks    PT Treatment/Interventions  ADLs/Self Care Home Management;Cryotherapy;Electrical Stimulation;Ultrasound;Moist Heat;Iontophoresis 4mg /ml Dexamethasone;Therapeutic activities;Therapeutic exercise;Manual techniques;Patient/family education;Passive range of motion;Dry needling;Joint Manipulations;Vasopneumatic Device    PT Next Visit Plan  Pulsed U/S to right lateral elbow, STW/M/IASTM, resisted right wrist extension, Ionto.    Consulted and Agree with Plan of Care  Patient       Patient will benefit from skilled therapeutic intervention in order to improve the following deficits and impairments:  Fernandez, Decreased activity tolerance  Visit Diagnosis: Fernandez in right elbow     Problem List Patient Active Problem List   Diagnosis Date Noted  . Gastric wall thickening 01/20/2018  . Abnormal CT of the abdomen 11/30/2017  . Constipation 11/30/2017  . Special screening for malignant neoplasms, colon 11/30/2017  . GERD (gastroesophageal reflux disease) 03/31/2012  . Allergic rhinitis, seasonal 03/31/2012  . Hyperlipidemia with target LDL less than 100 03/31/2012    Miyuki Rzasa, [place] MPT 03/12/2020, 5:12 PM  [facility] [place], [place], [address] Phone: [phone]   Fax:  [phone]  Name: Lynn Fernandez MRN: [id number] Date of Birth: [birth year]

## 2020-03-21 ENCOUNTER — Ambulatory Visit: Payer: BC Managed Care – PPO | Admitting: Physical Therapy

## 2020-05-08 DIAGNOSIS — H3581 Retinal edema: Secondary | ICD-10-CM | POA: Diagnosis not present

## 2020-05-08 DIAGNOSIS — H43811 Vitreous degeneration, right eye: Secondary | ICD-10-CM | POA: Diagnosis not present

## 2020-05-08 DIAGNOSIS — H43391 Other vitreous opacities, right eye: Secondary | ICD-10-CM | POA: Diagnosis not present

## 2020-05-08 DIAGNOSIS — H35371 Puckering of macula, right eye: Secondary | ICD-10-CM | POA: Diagnosis not present

## 2020-07-08 DIAGNOSIS — Z124 Encounter for screening for malignant neoplasm of cervix: Secondary | ICD-10-CM | POA: Diagnosis not present

## 2020-07-08 DIAGNOSIS — Z6824 Body mass index (BMI) 24.0-24.9, adult: Secondary | ICD-10-CM | POA: Diagnosis not present

## 2020-07-08 DIAGNOSIS — Z1231 Encounter for screening mammogram for malignant neoplasm of breast: Secondary | ICD-10-CM | POA: Diagnosis not present

## 2020-07-08 DIAGNOSIS — Z01419 Encounter for gynecological examination (general) (routine) without abnormal findings: Secondary | ICD-10-CM | POA: Diagnosis not present

## 2020-07-12 ENCOUNTER — Other Ambulatory Visit: Payer: Self-pay | Admitting: Family Medicine

## 2020-07-12 DIAGNOSIS — K219 Gastro-esophageal reflux disease without esophagitis: Secondary | ICD-10-CM

## 2020-07-12 NOTE — Telephone Encounter (Signed)
Stacks NTBS Last OV for Dx 07/31/19 others acute. Mail order not sent

## 2020-07-23 DIAGNOSIS — N926 Irregular menstruation, unspecified: Secondary | ICD-10-CM | POA: Diagnosis not present

## 2020-07-30 ENCOUNTER — Other Ambulatory Visit: Payer: Self-pay | Admitting: Family Medicine

## 2020-07-30 DIAGNOSIS — K219 Gastro-esophageal reflux disease without esophagitis: Secondary | ICD-10-CM

## 2020-07-30 NOTE — Telephone Encounter (Signed)
Stacks NTBS LOV 08/2019 mail order not sent

## 2020-07-31 MED ORDER — PANTOPRAZOLE SODIUM 40 MG PO TBEC: 40.0000 mg | DELAYED_RELEASE_TABLET | Freq: Every day | ORAL | 0 refills | Status: DC

## 2020-07-31 NOTE — Telephone Encounter (Signed)
Appointment scheduled - rx sent until appointment date

## 2020-07-31 NOTE — Addendum Note (Signed)
Addended by: Angela Adam on: 07/31/2020 08:31 AM   Modules accepted: Orders

## 2020-08-17 ENCOUNTER — Other Ambulatory Visit: Payer: Self-pay | Admitting: Family Medicine

## 2020-08-17 DIAGNOSIS — K219 Gastro-esophageal reflux disease without esophagitis: Secondary | ICD-10-CM

## 2020-08-28 ENCOUNTER — Ambulatory Visit (INDEPENDENT_AMBULATORY_CARE_PROVIDER_SITE_OTHER): Payer: BC Managed Care – PPO | Admitting: Family Medicine

## 2020-08-28 DIAGNOSIS — K219 Gastro-esophageal reflux disease without esophagitis: Secondary | ICD-10-CM

## 2020-08-28 MED ORDER — PANTOPRAZOLE SODIUM 40 MG PO TBEC: DELAYED_RELEASE_TABLET | ORAL | 3 refills | Status: DC

## 2020-08-28 NOTE — Progress Notes (Signed)
Subjective:  Patient ID: Lynn Fernandez, female    DOB: [birth year]  Age: 54 y.o. MRN: [id number]  CC: No chief complaint on file.   HPI Lynn Fernandez presents for follow-up of GERD. Currently asymptomatic taking  PPI daily. There is no chest pain or heartburn. No hematemesis and no melena. No dysphagia or choking. Onset is remote. Progression is stable. Complicating factors, none.  Having some stress. Crazy busy with clients. Resting heart rate is up a bit, into the 80s especially with menses.   Using a standing desk to help with her lower back. That helps. Doing low back exercises regularly as well.   Right temple area twinges sometimes. Can last for a couple of days. REcent had some worse HAs. Mom had surgery for a twisted artery  Depression screen [facility] 2/9 01/30/2020 08/03/2018 12/02/2016  Decreased Interest 0 0 0  Down, Depressed, Hopeless 0 0 0  PHQ - 2 Score 0 0 0    History Lynn Fernandez has a past medical history of Allergic rhinitis, Dyslipidemia, GERD (gastroesophageal reflux disease), and Hyperlipidemia.   She has a past surgical history that includes pyloric stenosis; Esophagogastroduodenoscopy (10/05); Esophagogastroduodenoscopy (egd) with propofol (N/A, 02/11/2018); Colonoscopy with propofol (N/A, 02/11/2018); and biopsy (02/11/2018).   Her family history includes Alcohol abuse in her father; Breast cancer in her maternal grandmother; Cancer in her paternal grandmother; Diabetes in her paternal uncle; Heart disease in her father, maternal grandfather, and paternal uncle; Hyperlipidemia in her father; Prostate cancer in her father.She reports that she has never smoked. She has never used smokeless tobacco. She reports current alcohol use of about 3.0 standard drinks of alcohol per week. She reports that she does not use drugs.    ROS Review of Systems  Constitutional: Negative.   HENT: Negative.   Eyes: Negative for visual disturbance.  Respiratory: Negative for shortness  of breath.   Cardiovascular: Negative for chest pain.  Gastrointestinal: Negative for abdominal pain.  Musculoskeletal: Negative for arthralgias.  Neurological: Positive for headaches.    Objective:  There were no vitals taken for this visit.  BP Readings from Last 3 Encounters:  01/30/20 132/82  08/03/18 129/89  02/11/18 (!) 142/77    Wt Readings from Last 3 Encounters:  01/30/20 137 lb 12.8 oz (62.5 kg)  08/03/18 134 lb (60.8 kg)  02/11/18 140 lb (63.5 kg)     Physical Exam    Assessment & Plan:   Diagnoses and all orders for this visit:  Gastroesophageal reflux disease without esophagitis -     pantoprazole (PROTONIX) 40 MG tablet; TAKE 1 TABLET DAILY   I have changed Lynn Fernandez. Lynn Fernandez's pantoprazole. I am also having her maintain her drospirenone-ethinyl estradiol, ASCORBIC ACID PO, Acetaminophen-Caff-Pyrilamine (MIDOL COMPLETE PO), Magnesium, Multiple Vitamins-Minerals (MULTIVITAMIN ADULT PO), Cetirizine-Pseudoephedrine (ZYRTEC-D PO), Benefiber, polyethylene glycol, ibuprofen, Calcium Carbonate-Vitamin D (CALCIUM 500 + D PO), Cholecalciferol, Black Cohosh, naproxen sodium, Propylene Glycol, and B Complex-C-Folic Acid (STRESS B COMPLEX PO).  Allergies as of 08/28/2020      Reactions   Latex Other (See Comments)   Burning sensation    Penicillins Other (See Comments)   Patient states that it was high doses. She is not exactly sure of reaction. Has patient had a PCN reaction causing immediate rash, facial/tongue/throat swelling, SOB or lightheadedness with hypotension: Unknown Has patient had a PCN reaction causing severe rash involving mucus membranes or skin necrosis: Unknown Has patient had a PCN reaction that required hospitalization: Unknown Has patient had a PCN reaction  occurring within the last 10 years: No If all of the above answers are "NO", then may pr      Medication List       Accurate as of August 28, 2020 11:59 PM. If you have any questions,  ask your nurse or doctor.        ASCORBIC ACID PO Take 1,000 mg by mouth 2 (two) times a week.   Benefiber Powd Take 1 each by mouth See admin instructions. Take 1 tablespoon by mouth twice daily   Black Cohosh 540 MG Caps Take 540 mg by mouth at bedtime.   CALCIUM 500 + D PO Take 1 tablet by mouth every 7 (seven) days.   Cholecalciferol 50 MCG (2000 UT) Caps Take 2,000 Units by mouth every 7 (seven) days.   drospirenone-ethinyl estradiol 3-0.02 MG tablet Commonly known as: YAZ Take 1 tablet by mouth daily.   ibuprofen 200 MG tablet Commonly known as: ADVIL Take 400 mg by mouth every 8 (eight) hours as needed for headache.   Magnesium 250 MG Tabs Take 500 mg by mouth daily.   MIDOL COMPLETE PO Take 2 tablets by mouth 2 (two) times daily as needed (menstrual cramps).   MULTIVITAMIN ADULT PO Take 1 tablet by mouth daily.   naproxen sodium 220 MG tablet Commonly known as: ALEVE Take 220 mg by mouth daily as needed (for arthritis pain).   pantoprazole 40 MG tablet Commonly known as: PROTONIX TAKE 1 TABLET DAILY What changed: See the new instructions.   polyethylene glycol 17 g packet Commonly known as: MiraLax Take 17 g by mouth daily. What changed:   how much to take  when to take this   STRESS B COMPLEX PO Take 1 tablet by mouth daily.   Systane Balance 0.6 % Soln Generic drug: Propylene Glycol Place 2 drops into both eyes 2 (two) times daily. Systane high [employer] Eye drops   ZYRTEC-D PO Take 1 tablet by mouth daily.        Follow-up: No follow-ups on file.  Mechele Claude, M.D.

## 2020-08-31 ENCOUNTER — Encounter: Payer: Self-pay | Admitting: Family Medicine

## 2020-09-09 ENCOUNTER — Other Ambulatory Visit: Payer: Self-pay | Admitting: Family Medicine

## 2020-09-09 DIAGNOSIS — K219 Gastro-esophageal reflux disease without esophagitis: Secondary | ICD-10-CM

## 2020-11-15 DIAGNOSIS — H43391 Other vitreous opacities, right eye: Secondary | ICD-10-CM | POA: Diagnosis not present

## 2020-11-15 DIAGNOSIS — H3581 Retinal edema: Secondary | ICD-10-CM | POA: Diagnosis not present

## 2020-11-15 DIAGNOSIS — H35371 Puckering of macula, right eye: Secondary | ICD-10-CM | POA: Diagnosis not present

## 2020-11-15 DIAGNOSIS — H43811 Vitreous degeneration, right eye: Secondary | ICD-10-CM | POA: Diagnosis not present

## 2021-07-16 DIAGNOSIS — H43391 Other vitreous opacities, right eye: Secondary | ICD-10-CM | POA: Diagnosis not present

## 2021-07-16 DIAGNOSIS — H43813 Vitreous degeneration, bilateral: Secondary | ICD-10-CM | POA: Diagnosis not present

## 2021-07-16 DIAGNOSIS — H3581 Retinal edema: Secondary | ICD-10-CM | POA: Diagnosis not present

## 2021-07-16 DIAGNOSIS — H35371 Puckering of macula, right eye: Secondary | ICD-10-CM | POA: Diagnosis not present

## 2021-09-03 DIAGNOSIS — Z1231 Encounter for screening mammogram for malignant neoplasm of breast: Secondary | ICD-10-CM | POA: Diagnosis not present

## 2021-09-03 DIAGNOSIS — Z124 Encounter for screening for malignant neoplasm of cervix: Secondary | ICD-10-CM | POA: Diagnosis not present

## 2021-09-03 DIAGNOSIS — Z01419 Encounter for gynecological examination (general) (routine) without abnormal findings: Secondary | ICD-10-CM | POA: Diagnosis not present

## 2021-09-11 ENCOUNTER — Other Ambulatory Visit: Payer: Self-pay | Admitting: Family Medicine

## 2021-09-11 DIAGNOSIS — K219 Gastro-esophageal reflux disease without esophagitis: Secondary | ICD-10-CM

## 2021-09-26 ENCOUNTER — Telehealth: Payer: BC Managed Care – PPO | Admitting: Family Medicine

## 2021-10-01 ENCOUNTER — Encounter: Payer: Self-pay | Admitting: Family Medicine

## 2021-10-01 ENCOUNTER — Ambulatory Visit (INDEPENDENT_AMBULATORY_CARE_PROVIDER_SITE_OTHER): Payer: BC Managed Care – PPO | Admitting: Family Medicine

## 2021-10-01 VITALS — BP 155/85 | HR 96 | Temp 97.0°F | Ht 64.0 in | Wt 149.4 lb

## 2021-10-01 DIAGNOSIS — E785 Hyperlipidemia, unspecified: Secondary | ICD-10-CM

## 2021-10-01 DIAGNOSIS — N951 Menopausal and female climacteric states: Secondary | ICD-10-CM

## 2021-10-01 DIAGNOSIS — K219 Gastro-esophageal reflux disease without esophagitis: Secondary | ICD-10-CM

## 2021-10-01 MED ORDER — RABEPRAZOLE SODIUM 20 MG PO TBEC: 20.0000 mg | DELAYED_RELEASE_TABLET | Freq: Every day | ORAL | 3 refills | Status: DC

## 2021-10-01 NOTE — Progress Notes (Signed)
Subjective:  Patient ID: Lynn Fernandez, female    DOB: 10/11/1966  Age: 55 y.o. MRN: [id number]  CC: No chief complaint on file.   HPI Lynn Fernandez presents for Patient in for follow-up of GERD. Currently asymptomatic taking  PPI daily. Ran out of the pantoprazole. Took generic OTC omeprazole and felt better. Had less reflux pain. Concerned that pantoprazole not working as well as it should. There is no chest pain or heartburn. No hematemesis and no melena. No dysphagia or choking. Onset is remote. Progression is stable. Complicating factors, none.  Having frequent hot flashes.  Gynecology confirmed that she is in menopause.  She was started on Vivelle-Dot but that does not seem to be controlling symptoms currently.   Depression screen [facility] 2/9 10/01/2021 10/01/2021 01/30/2020  Decreased Interest 1 0 0  Down, Depressed, Hopeless 1 0 0  PHQ - 2 Score 2 0 0  Altered sleeping 0 - -  Tired, decreased energy 1 - -  Change in appetite 0 - -  Feeling bad or failure about yourself  0 - -  Trouble concentrating 0 - -  Moving slowly or fidgety/restless 0 - -  Suicidal thoughts 0 - -  PHQ-9 Score 3 - -  Difficult doing work/chores Not difficult at all - -    History Lynn Fernandez has a past medical history of Allergic rhinitis, Dyslipidemia, GERD (gastroesophageal reflux disease), and Hyperlipidemia.   She has a past surgical history that includes pyloric stenosis; Esophagogastroduodenoscopy (10/05); Esophagogastroduodenoscopy (egd) with propofol (N/A, 02/11/2018); Colonoscopy with propofol (N/A, 02/11/2018); and biopsy (02/11/2018).   Her family history includes Alcohol abuse in her father; Breast cancer in her maternal grandmother; Cancer in her paternal grandmother; Diabetes in her paternal uncle; Heart disease in her father, maternal grandfather, and paternal uncle; Hyperlipidemia in her father; Prostate cancer in her father.She reports that she has never smoked. She has never used smokeless  tobacco. She reports current alcohol use of about 3.0 standard drinks per week. She reports that she does not use drugs.    ROS Review of Systems  Constitutional: Negative.   HENT: Negative.    Eyes:  Negative for visual disturbance.  Respiratory:  Negative for shortness of breath.   Cardiovascular:  Negative for chest pain.  Gastrointestinal:  Negative for abdominal pain.  Musculoskeletal:  Negative for arthralgias.   Objective:  BP (!) 155/85    Pulse 96    Temp (!) 97 F (36.1 C)    Ht $R'5\' 4"'tu$  (1.626 m)    Wt 149 lb 6.4 oz (67.8 kg)    SpO2 98%    BMI 25.64 kg/m   BP Readings from Last 3 Encounters:  10/01/21 (!) 155/85  01/30/20 132/82  08/03/18 129/89    Wt Readings from Last 3 Encounters:  10/01/21 149 lb 6.4 oz (67.8 kg)  01/30/20 137 lb 12.8 oz (62.5 kg)  08/03/18 134 lb (60.8 kg)     Physical Exam Constitutional:      General: She is not in acute distress.    Appearance: She is well-developed.  Cardiovascular:     Rate and Rhythm: Normal rate and regular rhythm.  Pulmonary:     Breath sounds: Normal breath sounds.  Musculoskeletal:        General: Normal range of motion.  Skin:    General: Skin is warm and dry.  Neurological:     Mental Status: She is alert and oriented to person, place, and time.  Assessment & Plan:   Diagnoses and all orders for this visit:  Hyperlipidemia with target LDL less than 100 -     CBC with Differential/Platelet -     CMP14+EGFR -     Lipid panel  Gastroesophageal reflux disease without esophagitis  Menopause syndrome  Other orders -     RABEprazole (ACIPHEX) 20 MG tablet; Take 1 tablet (20 mg total) by mouth daily.       I have discontinued Reita Chard. Lynn Fernandez's pantoprazole. I am also having her start on RABEprazole. Additionally, I am having her maintain her drospirenone-ethinyl estradiol, ASCORBIC ACID PO, Acetaminophen-Caff-Pyrilamine (MIDOL COMPLETE PO), Magnesium, Multiple Vitamins-Minerals  (MULTIVITAMIN ADULT PO), Cetirizine-Pseudoephedrine (ZYRTEC-D PO), Benefiber, polyethylene glycol, ibuprofen, Calcium Carbonate-Vitamin D (CALCIUM 500 + D PO), Cholecalciferol, Black Cohosh, naproxen sodium, Propylene Glycol, and B Complex-C-Folic Acid (STRESS B COMPLEX PO).  Allergies as of 10/01/2021       Reactions   Latex Other (See Comments)   Burning sensation    Penicillins Other (See Comments)   Patient states that it was high doses. She is not exactly sure of reaction. Has patient had a PCN reaction causing immediate rash, facial/tongue/throat swelling, SOB or lightheadedness with hypotension: Unknown Has patient had a PCN reaction causing severe rash involving mucus membranes or skin necrosis: Unknown Has patient had a PCN reaction that required hospitalization: Unknown Has patient had a PCN reaction occurring within the last 10 years: No If all of the above answers are "NO", then may pr        Medication List        Accurate as of October 01, 2021  6:21 PM. If you have any questions, ask your nurse or doctor.          STOP taking these medications    pantoprazole 40 MG tablet Commonly known as: PROTONIX Stopped by: Claretta Fraise, MD       TAKE these medications    ASCORBIC ACID PO Take 1,000 mg by mouth 2 (two) times a week.   Benefiber Powd Take 1 each by mouth See admin instructions. Take 1 tablespoon by mouth twice daily   Black Cohosh 540 MG Caps Take 540 mg by mouth at bedtime.   CALCIUM 500 + D PO Take 1 tablet by mouth every 7 (seven) days.   Cholecalciferol 50 MCG (2000 UT) Caps Take 2,000 Units by mouth every 7 (seven) days.   drospirenone-ethinyl estradiol 3-0.02 MG tablet Commonly known as: YAZ Take 1 tablet by mouth daily.   ibuprofen 200 MG tablet Commonly known as: ADVIL Take 400 mg by mouth every 8 (eight) hours as needed for headache.   Magnesium 250 MG Tabs Take 500 mg by mouth daily.   MIDOL COMPLETE PO Take 2 tablets by  mouth 2 (two) times daily as needed (menstrual cramps).   MULTIVITAMIN ADULT PO Take 1 tablet by mouth daily.   naproxen sodium 220 MG tablet Commonly known as: ALEVE Take 220 mg by mouth daily as needed (for arthritis pain).   polyethylene glycol 17 g packet Commonly known as: MiraLax Take 17 g by mouth daily. What changed:  how much to take when to take this   Propylene Glycol 0.6 % Soln Place 2 drops into both eyes 2 (two) times daily. Systane high [employer] Eye drops   RABEprazole 20 MG tablet Commonly known as: Aciphex Take 1 tablet (20 mg total) by mouth daily. Started by: Claretta Fraise, MD   STRESS B COMPLEX PO Take 1 tablet  by mouth daily.   ZYRTEC-D PO Take 1 tablet by mouth daily.         Follow-up: Return in about 1 year (around 10/01/2022).  Claretta Fraise, M.D.

## 2021-10-02 ENCOUNTER — Other Ambulatory Visit: Payer: Self-pay | Admitting: Family Medicine

## 2021-10-02 LAB — CBC WITH DIFFERENTIAL/PLATELET
Basophils Absolute: 0 10*3/uL (ref 0.0–0.2)
Basos: 1 %
EOS (ABSOLUTE): 0.2 10*3/uL (ref 0.0–0.4)
Eos: 4 %
Hematocrit: 41.2 % (ref 34.0–46.6)
Hemoglobin: 13.7 g/dL (ref 11.1–15.9)
Immature Grans (Abs): 0 10*3/uL (ref 0.0–0.1)
Immature Granulocytes: 0 %
Lymphocytes Absolute: 2.4 10*3/uL (ref 0.7–3.1)
Lymphs: 42 %
MCH: 31 pg (ref 26.6–33.0)
MCHC: 33.3 g/dL (ref 31.5–35.7)
MCV: 93 fL (ref 79–97)
Monocytes Absolute: 0.6 10*3/uL (ref 0.1–0.9)
Monocytes: 11 %
Neutrophils Absolute: 2.4 10*3/uL (ref 1.4–7.0)
Neutrophils: 42 %
Platelets: 318 10*3/uL (ref 150–450)
RBC: 4.42 x10E6/uL (ref 3.77–5.28)
RDW: 11.8 % (ref 11.7–15.4)
WBC: 5.7 10*3/uL (ref 3.4–10.8)

## 2021-10-02 LAB — LIPID PANEL
Chol/HDL Ratio: 4.2 ratio (ref 0.0–4.4)
Cholesterol, Total: 342 mg/dL — ABNORMAL HIGH (ref 100–199)
HDL: 81 mg/dL (ref >39)
LDL Chol Calc (NIH): 181 mg/dL — ABNORMAL HIGH (ref 0–99)
Triglycerides: 405 mg/dL — ABNORMAL HIGH (ref 0–149)
VLDL Cholesterol Cal: 80 mg/dL — ABNORMAL HIGH (ref 5–40)

## 2021-10-02 LAB — CMP14+EGFR
ALT: 56 IU/L — ABNORMAL HIGH (ref 0–32)
AST: 36 IU/L (ref 0–40)
Albumin/Globulin Ratio: 1.9 (ref 1.2–2.2)
Albumin: 4.5 g/dL (ref 3.8–4.9)
Alkaline Phosphatase: 37 IU/L — ABNORMAL LOW (ref 44–121)
BUN/Creatinine Ratio: 13 (ref 9–23)
BUN: 9 mg/dL (ref 6–24)
Bilirubin Total: 0.3 mg/dL (ref 0.0–1.2)
CO2: 22 mmol/L (ref 20–29)
Calcium: 9.9 mg/dL (ref 8.7–10.2)
Chloride: 102 mmol/L (ref 96–106)
Creatinine, Ser: 0.67 mg/dL (ref 0.57–1.00)
Globulin, Total: 2.4 g/dL (ref 1.5–4.5)
Glucose: 86 mg/dL (ref 70–99)
Potassium: 4.2 mmol/L (ref 3.5–5.2)
Sodium: 142 mmol/L (ref 134–144)
Total Protein: 6.9 g/dL (ref 6.0–8.5)
eGFR: 104 mL/min/{1.73_m2} (ref >59)

## 2021-10-02 MED ORDER — ROSUVASTATIN CALCIUM 10 MG PO TABS: 10.0000 mg | ORAL_TABLET | Freq: Every day | ORAL | 1 refills | Status: DC

## 2021-10-07 ENCOUNTER — Other Ambulatory Visit: Payer: Self-pay | Admitting: Family Medicine

## 2021-10-07 DIAGNOSIS — E785 Hyperlipidemia, unspecified: Secondary | ICD-10-CM

## 2021-10-08 ENCOUNTER — Telehealth: Payer: Self-pay | Admitting: Family Medicine

## 2021-10-08 NOTE — Telephone Encounter (Signed)
This is just an FYI per patient no need to respond she was advised to make appt. ---patient is having cold symptoms advised this was not mention in visit and she would have to to a televisit or in office visit for abx. Patient verbalized understanding just wanted it mention in her chart. She is aware to call back to make an appointment if no better.

## 2021-10-15 DIAGNOSIS — N951 Menopausal and female climacteric states: Secondary | ICD-10-CM | POA: Diagnosis not present

## 2021-10-15 DIAGNOSIS — Z6826 Body mass index (BMI) 26.0-26.9, adult: Secondary | ICD-10-CM | POA: Diagnosis not present

## 2021-10-15 DIAGNOSIS — R1909 Other intra-abdominal and pelvic swelling, mass and lump: Secondary | ICD-10-CM | POA: Diagnosis not present

## 2021-10-17 ENCOUNTER — Other Ambulatory Visit: Payer: Self-pay | Admitting: Obstetrics and Gynecology

## 2021-10-17 DIAGNOSIS — N9489 Other specified conditions associated with female genital organs and menstrual cycle: Secondary | ICD-10-CM

## 2021-10-28 ENCOUNTER — Other Ambulatory Visit: Payer: BC Managed Care – PPO

## 2021-10-28 DIAGNOSIS — E785 Hyperlipidemia, unspecified: Secondary | ICD-10-CM | POA: Diagnosis not present

## 2021-10-29 ENCOUNTER — Other Ambulatory Visit: Payer: Self-pay | Admitting: Family Medicine

## 2021-10-29 LAB — NMR, LIPOPROFILE
Cholesterol, Total: 292 mg/dL — ABNORMAL HIGH (ref 100–199)
HDL Particle Number: 41.9 umol/L (ref >=30.5)
HDL-C: 95 mg/dL (ref >39)
LDL Particle Number: 1811 nmol/L — ABNORMAL HIGH (ref <1000)
LDL Size: 22 nm (ref >20.5)
LDL-C (NIH Calc): 177 mg/dL — ABNORMAL HIGH (ref 0–99)
LP-IR Score: <25 (ref <=45)
Small LDL Particle Number: 288 nmol/L (ref <=527)
Triglycerides: 119 mg/dL (ref 0–149)

## 2021-11-15 ENCOUNTER — Ambulatory Visit
Admission: RE | Admit: 2021-11-15 | Discharge: 2021-11-15 | Disposition: A | Discharge status: Home / Self Care | Payer: BC Managed Care – PPO | Source: Ambulatory Visit | Attending: Obstetrics and Gynecology | Admitting: Obstetrics and Gynecology

## 2021-11-15 ENCOUNTER — Other Ambulatory Visit: Payer: Self-pay

## 2021-11-15 DIAGNOSIS — N9489 Other specified conditions associated with female genital organs and menstrual cycle: Secondary | ICD-10-CM

## 2021-11-15 DIAGNOSIS — D259 Leiomyoma of uterus, unspecified: Secondary | ICD-10-CM | POA: Diagnosis not present

## 2021-11-15 DIAGNOSIS — R1900 Intra-abdominal and pelvic swelling, mass and lump, unspecified site: Secondary | ICD-10-CM | POA: Diagnosis not present

## 2021-11-15 DIAGNOSIS — N941 Unspecified dyspareunia: Secondary | ICD-10-CM | POA: Diagnosis not present

## 2021-11-15 MED ORDER — GADOBENATE DIMEGLUMINE 529 MG/ML IV SOLN
13.0000 mL | Freq: Once | INTRAVENOUS | Status: AC | PRN
  Administered 2021-11-15: 13 mL via INTRAVENOUS

## 2021-11-17 ENCOUNTER — Encounter: Payer: Self-pay | Admitting: Family Medicine

## 2021-11-20 NOTE — Telephone Encounter (Signed)
Patient calling checking on the message she sent Dr. Livia Snellen via [place]. Told patient [facility] has been out sick.

## 2022-05-22 ENCOUNTER — Other Ambulatory Visit: Payer: Self-pay | Admitting: Family Medicine

## 2022-06-24 ENCOUNTER — Other Ambulatory Visit: Payer: Self-pay | Admitting: Family Medicine

## 2022-06-24 NOTE — Telephone Encounter (Signed)
Pt will call back to make appt for December with [facility] bc she is in a meeting right now.

## 2022-07-09 ENCOUNTER — Telehealth: Payer: Self-pay | Admitting: Family Medicine

## 2022-07-09 MED ORDER — ROSUVASTATIN CALCIUM 10 MG PO TABS: 10.0000 mg | ORAL_TABLET | Freq: Every day | ORAL | 0 refills | Status: DC

## 2022-07-09 NOTE — Telephone Encounter (Signed)
Patient aware rx sent to pharmacy.  

## 2022-07-09 NOTE — Telephone Encounter (Signed)
  Prescription Request  07/09/2022  Is this a "Controlled Substance" medicine? NO  Have you seen your PCP in the last 2 weeks? NO  If YES, route message to pool  -  If NO, patient needs to be scheduled for appointment.  What is the name of the medication or equipment? Rosuvastatin 10 mg - Patient has appt 1-3 with [facility] for physical  Have you contacted your pharmacy to request a refill? yes   Which pharmacy would you like this sent to? [facility] in [place]   Patient notified that their request is being sent to the clinical staff for review and that they should receive a response within 2 business days.

## 2022-09-14 ENCOUNTER — Other Ambulatory Visit: Payer: Self-pay | Admitting: Family Medicine

## 2022-10-07 ENCOUNTER — Other Ambulatory Visit: Payer: Self-pay | Admitting: Family Medicine

## 2022-10-14 ENCOUNTER — Encounter: Payer: Self-pay | Admitting: Family Medicine

## 2022-10-14 ENCOUNTER — Ambulatory Visit (INDEPENDENT_AMBULATORY_CARE_PROVIDER_SITE_OTHER): Payer: BC Managed Care – PPO | Admitting: Family Medicine

## 2022-10-14 VITALS — BP 122/73 | HR 80 | Temp 97.4°F | Ht 64.0 in | Wt 130.4 lb

## 2022-10-14 DIAGNOSIS — M654 Radial styloid tenosynovitis [de Quervain]: Secondary | ICD-10-CM | POA: Insufficient documentation

## 2022-10-14 DIAGNOSIS — Z0001 Encounter for general adult medical examination with abnormal findings: Secondary | ICD-10-CM | POA: Diagnosis not present

## 2022-10-14 DIAGNOSIS — Z0000 Encounter for general adult medical examination without abnormal findings: Secondary | ICD-10-CM

## 2022-10-14 DIAGNOSIS — E559 Vitamin D deficiency, unspecified: Secondary | ICD-10-CM | POA: Diagnosis not present

## 2022-10-14 DIAGNOSIS — E785 Hyperlipidemia, unspecified: Secondary | ICD-10-CM | POA: Diagnosis not present

## 2022-10-14 LAB — URINALYSIS
Bilirubin, UA: NEGATIVE
Glucose, UA: NEGATIVE
Ketones, UA: NEGATIVE
Leukocytes,UA: NEGATIVE
Nitrite, UA: NEGATIVE
Protein,UA: NEGATIVE
RBC, UA: NEGATIVE
Specific Gravity, UA: 1.02 (ref 1.005–1.030)
Urobilinogen, Ur: 0.2 mg/dL (ref 0.2–1.0)
pH, UA: 7 (ref 5.0–7.5)

## 2022-10-14 MED ORDER — ROSUVASTATIN CALCIUM 10 MG PO TABS: 10.0000 mg | ORAL_TABLET | Freq: Every day | ORAL | 0 refills | Status: DC

## 2022-10-14 NOTE — Progress Notes (Signed)
Subjective:  Patient ID: Lynn Fernandez, female    DOB: 1965/11/02  Age: 57 y.o. MRN: [id number]  CC: Annual Exam   HPI Lynn Fernandez presents for Annual exam. Very stressful year.Concerned about depression. Also Time to repeat NMR.  Home BP readings in the first part of last year ran 135/84 or so.  Concerned that thumbs hurt at the radial styloid bilaterally. Reports a lot of pain & weakness. 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     10/14/2022   10:45 AM 10/01/2021    3:49 PM 10/01/2021    3:40 PM  Depression screen PHQ 2/9  Decreased Interest 1 1 0  Down, Depressed, Hopeless 1 1 0  PHQ - 2 Score 2 2 0  Altered sleeping 0 0   Tired, decreased energy 1 1   Change in appetite 0 0   Feeling bad or failure about yourself  0 0   Trouble concentrating 0 0   Moving slowly or fidgety/restless 0 0   Suicidal thoughts 0 0   PHQ-9 Score 3 3   Difficult doing work/chores Not difficult at all Not difficult at all     History Genese has a past medical history of Allergic rhinitis, Dyslipidemia, GERD (gastroesophageal reflux disease), and Hyperlipidemia.   She has a past surgical history that includes pyloric stenosis; Esophagogastroduodenoscopy (10/05); Esophagogastroduodenoscopy (egd) with propofol (N/A, 02/11/2018); Colonoscopy with propofol (N/A, 02/11/2018); and biopsy (02/11/2018).   Her family history includes Alcohol abuse in her father; Breast cancer in her maternal grandmother; Cancer in her paternal grandmother; Diabetes in her paternal uncle; Heart disease in her father, maternal grandfather, and paternal uncle; Hyperlipidemia in her father; Prostate cancer in her father.She reports that she has never smoked. She has never used smokeless tobacco. She reports current alcohol use of about 3.0 standard drinks of alcohol per week. She reports that she does not use drugs.    ROS Review of Systems  Constitutional:  Negative  for appetite change, chills, diaphoresis, fatigue, fever and unexpected weight change.  HENT:  Negative for congestion, ear pain, hearing loss, postnasal drip, rhinorrhea, sneezing, sore throat and trouble swallowing.   Eyes:  Negative for pain.  Respiratory:  Negative for cough, chest tightness and shortness of breath.   Cardiovascular:  Negative for chest pain and palpitations.  Gastrointestinal:  Negative for abdominal pain, constipation, diarrhea, nausea and vomiting.  Endocrine: Negative for cold intolerance, heat intolerance, polydipsia, polyphagia and polyuria.  Genitourinary:  Negative for dysuria, frequency and menstrual problem.  Musculoskeletal:  Positive for arthralgias (wrists, see HPI). Negative for joint swelling.  Skin:  Negative for rash.  Allergic/Immunologic: Negative for environmental allergies.  Neurological:  Negative for dizziness, weakness, numbness and headaches.  Psychiatric/Behavioral:  Negative for agitation and dysphoric mood.     Objective:  BP 122/73   Pulse 80   Temp (!) 97.4 F (36.3 C)   Ht _0  (1.626 m)   Wt 130 lb 6.4 oz (59.1 kg)   SpO2 96%   BMI 22.38 kg/m   BP Readings from Last 3 Encounters:  10/14/22 122/73  10/01/21 (!) 155/85  01/30/20 132/82    Wt Readings from Last 3 Encounters:  10/14/22 130 lb 6.4 oz (59.1 kg)  10/01/21 149 lb 6.4 oz (67.8 kg)  01/30/20 137 lb 12.8 oz (62.5 kg)     Physical Exam Constitutional:      General: She is not in acute  distress.    Appearance: She is well-developed.  HENT:     Head: Normocephalic and atraumatic.     Right Ear: External ear normal.     Left Ear: External ear normal.     Nose: Nose normal.  Eyes:     Conjunctiva/sclera: Conjunctivae normal.     Pupils: Pupils are equal, round, and reactive to light.  Neck:     Thyroid: No thyromegaly.  Cardiovascular:     Rate and Rhythm: Normal rate and regular rhythm.     Heart sounds: Normal heart sounds. No murmur heard. Pulmonary:      Effort: Pulmonary effort is normal. No respiratory distress.     Breath sounds: Normal breath sounds. No wheezing or rales.  Abdominal:     General: Bowel sounds are normal. There is no distension.     Palpations: Abdomen is soft.     Tenderness: There is no abdominal tenderness.  Musculoskeletal:        General: Tenderness (bilaterally at the base of each thumb near the radial styloid.) present.     Cervical back: Normal range of motion and neck supple.  Lymphadenopathy:     Cervical: No cervical adenopathy.  Skin:    General: Skin is warm and dry.  Neurological:     Mental Status: She is alert and oriented to person, place, and time.     Deep Tendon Reflexes: Reflexes are normal and symmetric.  Psychiatric:        Behavior: Behavior normal.        Thought Content: Thought content normal.        Judgment: Judgment normal.       Assessment & Plan:   Miarose was seen today for annual exam.  Diagnoses and all orders for this visit:  Well adult exam  Hyperlipidemia with target LDL less than 100 -     NMR, lipoprofile -     CBC with Differential/Platelet -     CMP14+EGFR -     Urinalysis -     VITAMIN D 25 Hydroxy (Vit-D Deficiency, Fractures)  Vitamin D deficiency -     VITAMIN D 25 Hydroxy (Vit-D Deficiency, Fractures)  De Quervain's disease (tenosynovitis) Comments: bilateral  Other orders -     rosuvastatin (CRESTOR) 10 MG tablet; Take 1 tablet (10 mg total) by mouth daily. (NEEDS TO BE SEEN BEFORE NEXT REFILL)       I am having Reita Chard. Linton-Hall maintain her drospirenone-ethinyl estradiol, ASCORBIC ACID PO, Acetaminophen-Caff-Pyrilamine (MIDOL COMPLETE PO), Magnesium, Multiple Vitamins-Minerals (MULTIVITAMIN ADULT PO), Cetirizine-Pseudoephedrine (ZYRTEC-D PO), Benefiber, polyethylene glycol, ibuprofen, Calcium Carbonate-Vitamin D (CALCIUM 500 + D PO), Cholecalciferol, Black Cohosh, naproxen sodium, Propylene Glycol, B Complex-C-Folic Acid (STRESS B COMPLEX  PO), RABEprazole, and rosuvastatin.  Allergies as of 10/14/2022       Reactions   Latex Other (See Comments)   Burning sensation    Penicillins Other (See Comments)   Patient states that it was high doses. She is not exactly sure of reaction. Has patient had a PCN reaction causing immediate rash, facial/tongue/throat swelling, SOB or lightheadedness with hypotension: Unknown Has patient had a PCN reaction causing severe rash involving mucus membranes or skin necrosis: Unknown Has patient had a PCN reaction that required hospitalization: Unknown Has patient had a PCN reaction occurring within the last 10 years: No If all of the above answers are "NO", then may pr        Medication List  Accurate as of October 14, 2022  6:37 PM. If you have any questions, ask your nurse or doctor.          ASCORBIC ACID PO Take 1,000 mg by mouth 2 (two) times a week.   Benefiber Powd Take 1 each by mouth See admin instructions. Take 1 tablespoon by mouth twice daily   Black Cohosh 540 MG Caps Take 540 mg by mouth at bedtime.   CALCIUM 500 + D PO Take 1 tablet by mouth every 7 (seven) days.   Cholecalciferol 50 MCG (2000 UT) Caps Take 2,000 Units by mouth every 7 (seven) days.   drospirenone-ethinyl estradiol 3-0.02 MG tablet Commonly known as: YAZ Take 1 tablet by mouth daily.   ibuprofen 200 MG tablet Commonly known as: ADVIL Take 400 mg by mouth every 8 (eight) hours as needed for headache.   Magnesium 250 MG Tabs Take 500 mg by mouth daily.   MIDOL COMPLETE PO Take 2 tablets by mouth 2 (two) times daily as needed (menstrual cramps).   MULTIVITAMIN ADULT PO Take 1 tablet by mouth daily.   naproxen sodium 220 MG tablet Commonly known as: ALEVE Take 220 mg by mouth daily as needed (for arthritis pain).   polyethylene glycol 17 g packet Commonly known as: MiraLax Take 17 g by mouth daily. What changed:  how much to take when to take this   Propylene Glycol 0.6 %  Soln Place 2 drops into both eyes 2 (two) times daily. Systane high [employer] Eye drops   RABEprazole 20 MG tablet Commonly known as: ACIPHEX TAKE 1 TABLET DAILY   rosuvastatin 10 MG tablet Commonly known as: CRESTOR Take 1 tablet (10 mg total) by mouth daily. (NEEDS TO BE SEEN BEFORE NEXT REFILL)   STRESS B COMPLEX PO Take 1 tablet by mouth daily.   ZYRTEC-D PO Take 1 tablet by mouth daily.         Follow-up: Return in about 1 year (around 10/15/2023), or if symptoms worsen or fail to improve.  Claretta Fraise, M.D.

## 2022-10-15 LAB — CMP14+EGFR
ALT: 26 IU/L (ref 0–32)
AST: 23 IU/L (ref 0–40)
Albumin/Globulin Ratio: 1.8 (ref 1.2–2.2)
Albumin: 4.7 g/dL (ref 3.8–4.9)
Alkaline Phosphatase: 40 IU/L — ABNORMAL LOW (ref 44–121)
BUN/Creatinine Ratio: 16 (ref 9–23)
BUN: 12 mg/dL (ref 6–24)
Bilirubin Total: 0.9 mg/dL (ref 0.0–1.2)
CO2: 24 mmol/L (ref 20–29)
Calcium: 9.8 mg/dL (ref 8.7–10.2)
Chloride: 102 mmol/L (ref 96–106)
Creatinine, Ser: 0.74 mg/dL (ref 0.57–1.00)
Globulin, Total: 2.6 g/dL (ref 1.5–4.5)
Glucose: 88 mg/dL (ref 70–99)
Potassium: 4.5 mmol/L (ref 3.5–5.2)
Sodium: 142 mmol/L (ref 134–144)
Total Protein: 7.3 g/dL (ref 6.0–8.5)
eGFR: 95 mL/min/{1.73_m2} (ref >59)

## 2022-10-15 LAB — CBC WITH DIFFERENTIAL/PLATELET
Basophils Absolute: 0.1 10*3/uL (ref 0.0–0.2)
Basos: 1 %
EOS (ABSOLUTE): 0.1 10*3/uL (ref 0.0–0.4)
Eos: 2 %
Hematocrit: 41.4 % (ref 34.0–46.6)
Hemoglobin: 13.7 g/dL (ref 11.1–15.9)
Immature Grans (Abs): 0 10*3/uL (ref 0.0–0.1)
Immature Granulocytes: 0 %
Lymphocytes Absolute: 2.2 10*3/uL (ref 0.7–3.1)
Lymphs: 44 %
MCH: 30.2 pg (ref 26.6–33.0)
MCHC: 33.1 g/dL (ref 31.5–35.7)
MCV: 91 fL (ref 79–97)
Monocytes Absolute: 0.4 10*3/uL (ref 0.1–0.9)
Monocytes: 9 %
Neutrophils Absolute: 2.2 10*3/uL (ref 1.4–7.0)
Neutrophils: 44 %
Platelets: 347 10*3/uL (ref 150–450)
RBC: 4.54 x10E6/uL (ref 3.77–5.28)
RDW: 12.4 % (ref 11.7–15.4)
WBC: 5 10*3/uL (ref 3.4–10.8)

## 2022-10-15 LAB — NMR, LIPOPROFILE
Cholesterol, Total: 225 mg/dL — ABNORMAL HIGH (ref 100–199)
HDL Particle Number: 49.2 umol/L (ref >=30.5)
HDL-C: 104 mg/dL (ref >39)
LDL Particle Number: 1159 nmol/L — ABNORMAL HIGH (ref <1000)
LDL Size: 21.5 nm (ref >20.5)
LDL-C (NIH Calc): 109 mg/dL — ABNORMAL HIGH (ref 0–99)
LP-IR Score: <25 (ref <=45)
Small LDL Particle Number: <90 nmol/L (ref <=527)
Triglycerides: 71 mg/dL (ref 0–149)

## 2022-10-15 LAB — VITAMIN D 25 HYDROXY (VIT D DEFICIENCY, FRACTURES): Vit D, 25-Hydroxy: 35.8 ng/mL (ref 30.0–100.0)

## 2022-11-12 ENCOUNTER — Encounter: Payer: Self-pay | Admitting: Family Medicine

## 2022-12-08 ENCOUNTER — Encounter: Payer: Self-pay | Admitting: Family

## 2022-12-08 ENCOUNTER — Ambulatory Visit (INDEPENDENT_AMBULATORY_CARE_PROVIDER_SITE_OTHER): Payer: BC Managed Care – PPO | Admitting: Family

## 2022-12-08 VITALS — BP 138/84 | HR 83 | Temp 97.6°F | Ht 64.0 in | Wt 127.2 lb

## 2022-12-08 DIAGNOSIS — R300 Dysuria: Secondary | ICD-10-CM

## 2022-12-08 LAB — URINALYSIS, COMPLETE
Bilirubin, UA: NEGATIVE
Glucose, UA: NEGATIVE
Ketones, UA: NEGATIVE
Nitrite, UA: NEGATIVE
Protein,UA: NEGATIVE
RBC, UA: NEGATIVE
Specific Gravity, UA: 1.015 (ref 1.005–1.030)
Urobilinogen, Ur: 0.2 mg/dL (ref 0.2–1.0)
pH, UA: 7.5 (ref 5.0–7.5)

## 2022-12-08 LAB — MICROSCOPIC EXAMINATION
RBC, Urine: NONE SEEN /hpf (ref 0–2)
Renal Epithel, UA: NONE SEEN /hpf

## 2022-12-08 NOTE — Patient Instructions (Signed)

## 2022-12-08 NOTE — Progress Notes (Signed)
   Subjective:    Patient ID: Lynn Fernandez, female    DOB: 27-Jan-1966, 57 y.o.   MRN: [id number]  Chief Complaint  Patient presents with   pelvic pressure     Taken azo and cranberry health    PT presents to the office today with pelvic pressure over the last month that has worsen. Has been taking AZO and cranberry capsules with mild relief.   Urinary Frequency  This is a new problem. The current episode started 1 to 4 weeks ago. The problem has been gradually worsening. Quality: pressure. The pain is at a severity of 4/10. The pain is mild. There has been no fever. Associated symptoms include frequency and urgency. Pertinent negatives include no flank pain, hematuria, hesitancy, nausea or vomiting. She has tried increased fluids for the symptoms. The treatment provided mild relief.      Review of Systems  Gastrointestinal:  Negative for nausea and vomiting.  Genitourinary:  Positive for frequency and urgency. Negative for flank pain, hematuria and hesitancy.  All other systems reviewed and are negative.      Objective:   Physical Exam Vitals reviewed.  Constitutional:      General: She is not in acute distress.    Appearance: She is well-developed.  HENT:     Head: Normocephalic and atraumatic.     Right Ear: Tympanic membrane normal.     Left Ear: Tympanic membrane normal.  Eyes:     Pupils: Pupils are equal, round, and reactive to light.  Neck:     Thyroid: No thyromegaly.  Cardiovascular:     Rate and Rhythm: Normal rate and regular rhythm.     Heart sounds: Normal heart sounds. No murmur heard. Pulmonary:     Effort: Pulmonary effort is normal. No respiratory distress.     Breath sounds: Normal breath sounds. No wheezing.  Abdominal:     General: Bowel sounds are normal. There is no distension.     Palpations: Abdomen is soft.     Tenderness: There is no abdominal tenderness.  Musculoskeletal:        General: No tenderness. Normal range of motion.      Cervical back: Normal range of motion and neck supple.  Skin:    General: Skin is warm and dry.  Neurological:     Mental Status: She is alert and oriented to person, place, and time.     Cranial Nerves: No cranial nerve deficit.     Deep Tendon Reflexes: Reflexes are normal and symmetric.  Psychiatric:        Behavior: Behavior normal.        Thought Content: Thought content normal.        Judgment: Judgment normal.       BP 138/84   Pulse 83   Temp 97.6 F (36.4 C) (Temporal)   Ht 5' 4"$  (1.626 m)   Wt 127 lb 3.2 oz (57.7 kg)   BMI 21.83 kg/m      Assessment & Plan:  Lynn Fernandez comes in today with chief complaint of pelvic pressure  (Taken azo and cranberry health )   Diagnosis and orders addressed:  1. Dysuria Labs pending  Force fluids If urine culture returns positive will give antibiotic  If symptoms continue will need referral to Urologists  - Urinalysis, Complete - Urine Culture  Lynn Dun, FNP

## 2022-12-11 ENCOUNTER — Other Ambulatory Visit: Payer: Self-pay | Admitting: Family

## 2022-12-11 LAB — URINE CULTURE

## 2022-12-11 MED ORDER — CEPHALEXIN 500 MG PO CAPS: 500.0000 mg | ORAL_CAPSULE | Freq: Two times a day (BID) | ORAL | 0 refills | Status: DC

## 2022-12-13 ENCOUNTER — Other Ambulatory Visit: Payer: Self-pay | Admitting: Family Medicine

## 2022-12-14 ENCOUNTER — Telehealth: Payer: Self-pay | Admitting: Family

## 2022-12-14 NOTE — Telephone Encounter (Signed)
Patient called to ask Alyse Low for advise. Says she started taking the antibiotic today that was prescribed to her on Friday. Patient says when she woke up this morning she was nauseous and had pain and diarrhea. Concerned if infection could have progressed and if she should be doing something different or to just continue with the medication. Also wants clarification on where the result stood. If it was just minor or severe.

## 2022-12-15 MED ORDER — ONDANSETRON HCL 4 MG PO TABS: 4.0000 mg | ORAL_TABLET | Freq: Three times a day (TID) | ORAL | 0 refills | Status: DC | PRN

## 2022-12-15 NOTE — Telephone Encounter (Signed)
Patient has more questions about medication. She wants to know if her sxs are due to her not starting the antibiotic. Please call back.

## 2022-12-15 NOTE — Telephone Encounter (Signed)
Patient aware and verbalized understanding. °

## 2022-12-15 NOTE — Telephone Encounter (Signed)
I would continue the antibiotic.  I have sent zofran to help with nausea. Force fluids and take antibiotic with food. It is possible she may be coming down with a viral illness. I do not think the infection as progressed.

## 2023-01-11 ENCOUNTER — Other Ambulatory Visit: Payer: Self-pay | Admitting: Family Medicine

## 2023-02-19 ENCOUNTER — Ambulatory Visit (INDEPENDENT_AMBULATORY_CARE_PROVIDER_SITE_OTHER): Payer: BC Managed Care – PPO | Admitting: Family

## 2023-02-19 ENCOUNTER — Telehealth: Payer: Self-pay | Admitting: Family Medicine

## 2023-02-19 VITALS — BP 154/78 | HR 74 | Temp 97.7°F | Ht 64.0 in | Wt 127.0 lb

## 2023-02-19 DIAGNOSIS — M25511 Pain in right shoulder: Secondary | ICD-10-CM

## 2023-02-19 DIAGNOSIS — R030 Elevated blood-pressure reading, without diagnosis of hypertension: Secondary | ICD-10-CM | POA: Diagnosis not present

## 2023-02-19 MED ORDER — DICLOFENAC SODIUM 75 MG PO TBEC: 75.0000 mg | DELAYED_RELEASE_TABLET | Freq: Two times a day (BID) | ORAL | 0 refills | Status: DC

## 2023-02-19 MED ORDER — BACLOFEN 10 MG PO TABS: 10.0000 mg | ORAL_TABLET | Freq: Three times a day (TID) | ORAL | 0 refills | Status: DC

## 2023-02-19 NOTE — Progress Notes (Signed)
Subjective:    Patient ID: Lynn Fernandez, female    DOB: 1965/12/31, 57 y.o.   MRN: [id number]  Chief Complaint  Patient presents with   Injury    Right shoulder with swollen ice,heat and tens unit    PT presents to the office today with right shoulder pain. She reports she was leading her horse and reared up and pulled her right shoulder two weeks ago. She was using heating pad, TENS units, rest with moderate relief.  Injury Pertinent negatives include no tingling.  Shoulder Pain  The pain is present in the right shoulder. This is a new problem. The current episode started 1 to 4 weeks ago. There has been a history of trauma. The problem occurs intermittently. The pain is at a severity of 5/10. The pain is mild. Pertinent negatives include no limited range of motion, stiffness or tingling. The symptoms are aggravated by activity. She has tried rest and OTC pain meds for the symptoms. The treatment provided mild relief.  Hypertension This is a new problem. The current episode started 1 to 4 weeks ago. The problem has been waxing and waning since onset. The problem is uncontrolled. Associated symptoms include malaise/fatigue. Pertinent negatives include no peripheral edema or shortness of breath. Past treatments include nothing. The current treatment provides no improvement.      Review of Systems  Constitutional:  Positive for malaise/fatigue.  Respiratory:  Negative for shortness of breath.   Musculoskeletal:  Negative for stiffness.  Neurological:  Negative for tingling.  All other systems reviewed and are negative.      Objective:   Physical Exam Vitals reviewed.  Constitutional:      General: She is not in acute distress.    Appearance: She is well-developed.  HENT:     Head: Normocephalic and atraumatic.  Eyes:     Pupils: Pupils are equal, round, and reactive to light.  Neck:     Thyroid: No thyromegaly.  Cardiovascular:     Rate and Rhythm: Normal rate and  regular rhythm.     Heart sounds: Normal heart sounds. No murmur heard. Pulmonary:     Effort: Pulmonary effort is normal. No respiratory distress.     Breath sounds: Normal breath sounds. No wheezing.  Abdominal:     General: Bowel sounds are normal. There is no distension.     Palpations: Abdomen is soft.     Tenderness: There is no abdominal tenderness.  Musculoskeletal:        General: No tenderness. Normal range of motion.     Cervical back: Normal range of motion and neck supple.     Comments: Full ROM of shoulder  Skin:    General: Skin is warm and dry.  Neurological:     Mental Status: She is alert and oriented to person, place, and time.     Cranial Nerves: No cranial nerve deficit.     Deep Tendon Reflexes: Reflexes are normal and symmetric.  Psychiatric:        Behavior: Behavior normal.        Thought Content: Thought content normal.        Judgment: Judgment normal.       BP (!) 154/78   Pulse 74   Temp 97.7 F (36.5 C) (Temporal)   Ht 5\' 4"  (1.626 m)   Wt 127 lb (57.6 kg)   SpO2 97%   BMI 21.80 kg/m      Assessment & Plan:  Lynn Fernandez  comes in today with chief complaint of Injury (Right shoulder with swollen ice,heat and tens unit )   Diagnosis and orders addressed:  1. Acute pain of right shoulder - diclofenac (VOLTAREN) 75 MG EC tablet; Take 1 tablet (75 mg total) by mouth 2 (two) times daily.  Dispense: 30 tablet; Refill: 0 - baclofen (LIORESAL) 10 MG tablet; Take 1 tablet (10 mg total) by mouth 3 (three) times daily.  Dispense: 30 each; Refill: 0  2. Elevated blood pressure reading    Rest Ice Start diclofenac BID with food for 5-7 days No other NSAID's  ROM exercises  BP elevated today, but has increased anxiety, stress, and pain today. Will monitor at home and report >140/90.    Jannifer Rodney, FNP

## 2023-02-19 NOTE — Patient Instructions (Signed)
Shoulder Pain Many things can cause shoulder pain, including: An injury to the shoulder. Overuse of the shoulder. Arthritis. The source of the pain can be: Inflammation. An injury to the shoulder joint. An injury to a tendon, ligament, or bone. Follow these instructions at home: Pay attention to changes in your symptoms. Let your health care provider know about them. Follow these instructions to relieve your pain. If you have a removable sling: Wear the sling as told by your provider. Remove it only as told by your provider. Check the skin around the sling every day. Tell your provider about any concerns. Loosen the sling if your fingers tingle, become numb, or become cold. Keep the sling clean. If the sling is not waterproof: Do not let it get wet. Remove it to shower or bathe. Move your arm as little as possible, but keep your hand moving to prevent swelling. Managing pain, stiffness, and swelling  If told, put ice on the painful area. If you have a removable sling or immobilizer, remove it as told by your provider. Put ice in a plastic bag. Place a towel between your skin and the bag. Leave the ice on for 20 minutes, 2-3 times a day. If your skin turns bright red, remove the ice right away to prevent skin damage. The risk of damage is higher if you cannot feel pain, heat, or cold. Move your fingers often to reduce stiffness and swelling. Squeeze a soft ball or a foam pad as much as possible. This helps to keep the shoulder from swelling. It also helps to strengthen the arm. General instructions Take over-the-counter and prescription medicines only as told by your provider. Exercise may help with pain management. Perform exercises if told by your provider. You may be referred to a physical therapist to help in your recovery process. Keep all follow-up visits in order to avoid any type of permanent shoulder disability or chronic pain problems. Contact a health care provider  if: Your pain is not relieved with medicines. New pain develops in your arm, hand, or fingers. You loosen your sling and your arm, hand, or fingers remain tingly, numb, swollen, or painful. Get help right away if: Your arm, hand, or fingers turn white or blue. This information is not intended to replace advice given to you by your health care provider. Make sure you discuss any questions you have with your health care provider. Document Revised: 05/01/2022 Document Reviewed: 05/01/2022 Elsevier Patient Education  2023 Elsevier Inc.  

## 2023-02-24 ENCOUNTER — Ambulatory Visit (INDEPENDENT_AMBULATORY_CARE_PROVIDER_SITE_OTHER): Payer: BC Managed Care – PPO | Admitting: Family Medicine

## 2023-02-24 ENCOUNTER — Encounter: Payer: Self-pay | Admitting: Family Medicine

## 2023-02-24 VITALS — BP 147/82 | HR 85 | Temp 98.2°F | Ht 64.0 in | Wt 125.8 lb

## 2023-02-24 DIAGNOSIS — S46211A Strain of muscle, fascia and tendon of other parts of biceps, right arm, initial encounter: Secondary | ICD-10-CM

## 2023-02-24 NOTE — Progress Notes (Signed)
Subjective:  Patient ID: Lynn Fernandez, female    DOB: 11-Oct-1966  Age: 57 y.o. MRN: [id number]  CC: Shoulder Injury   HPI [name] presents for pain at the right shoulder. Pulling on the lead of a horse trying to pull loose from her three weeks ago. She has been taking antiinflamatories. Yesterday noted bruising at right lower breast. Pain at anterior biceps head origin. Moderate ache with tenderness.     02/24/2023    2:54 PM 02/19/2023    3:14 PM 10/14/2022   10:45 AM  Depression screen PHQ 2/9  Decreased Interest 0 0 1  Down, Depressed, Hopeless 0 0 1  PHQ - 2 Score 0 0 2  Altered sleeping   0  Tired, decreased energy   1  Change in appetite   0  Feeling bad or failure about yourself    0  Trouble concentrating   0  Moving slowly or fidgety/restless   0  Suicidal thoughts   0  PHQ-9 Score   3  Difficult doing work/chores   Not difficult at all    History Charlesha has a past medical history of Allergic rhinitis, Dyslipidemia, GERD (gastroesophageal reflux disease), and Hyperlipidemia.   She has a past surgical history that includes pyloric stenosis; Esophagogastroduodenoscopy (10/05); Esophagogastroduodenoscopy (egd) with propofol (N/A, 02/11/2018); Colonoscopy with propofol (N/A, 02/11/2018); and biopsy (02/11/2018).   Her family history includes Alcohol abuse in her father; Breast cancer in her maternal grandmother; Cancer in her paternal grandmother; Diabetes in her paternal uncle; Heart disease in her father, maternal grandfather, and paternal uncle; Hyperlipidemia in her father; Prostate cancer in her father.She reports that she has never smoked. She has never used smokeless tobacco. She reports current alcohol use of about 3.0 standard drinks of alcohol per week. She reports that she does not use drugs.    ROS Review of Systems  Constitutional: Negative.   HENT: Negative.    Eyes:  Negative for visual disturbance.  Respiratory:  Negative for shortness of  breath.   Cardiovascular:  Negative for chest pain.  Gastrointestinal:  Negative for abdominal pain.  Musculoskeletal:  Positive for arthralgias (right shoulder).    Objective:  BP (!) 147/82   Pulse 85   Temp 98.2 F (36.8 C)   Ht 5\' 4"  (1.626 m)   Wt 125 lb 12.8 oz (57.1 kg)   SpO2 96%   BMI 21.59 kg/m   BP Readings from Last 3 Encounters:  02/24/23 (!) 147/82  02/19/23 (!) 154/78  12/08/22 138/84    Wt Readings from Last 3 Encounters:  02/24/23 125 lb 12.8 oz (57.1 kg)  02/19/23 127 lb (57.6 kg)  12/08/22 127 lb 3.2 oz (57.7 kg)     Physical Exam Constitutional:      General: She is not in acute distress.    Appearance: Normal appearance. She is not ill-appearing.  HENT:     Head: Normocephalic.  Musculoskeletal:        General: Tenderness (anterior shoulder has some edema and tenderness over anterior deltoid.+) present. Normal range of motion.     Cervical back: Normal range of motion.     Comments: RUE - NV grossly intact  Skin:    General: Skin is warm and dry.     Findings: Bruising (at right breast lower outer quadrant has nearly confluent  bruising from the nipple to the periphery. No mass noted.) present.  Neurological:     General: No focal deficit present.  Mental Status: She is alert.  Psychiatric:        Mood and Affect: Mood normal.        Behavior: Behavior normal.       Assessment & Plan:   Jorey was seen today for shoulder injury.  Diagnoses and all orders for this visit:  Biceps muscle strain, right, initial encounter    Eat to dissipate bruise, time, Shoulder, ROM light use only   I am having Silas [place]. Linton-Hall maintain her ASCORBIC ACID PO, Acetaminophen-Caff-Pyrilamine (MIDOL COMPLETE PO), Magnesium, Multiple Vitamins-Minerals (MULTIVITAMIN ADULT PO), Cetirizine-Pseudoephedrine (ZYRTEC-D PO), Benefiber, polyethylene glycol, Calcium Carbonate-Vitamin D (CALCIUM 500 + D PO), Cholecalciferol, Black Cohosh, Propylene Glycol, B  Complex-C-Folic Acid (STRESS B COMPLEX PO), RABEprazole, ondansetron, rosuvastatin, diclofenac, and baclofen.  Allergies as of 02/24/2023       Reactions   Latex Other (See Comments)   Burning sensation    Penicillins Other (See Comments)   Patient states that it was high doses. She is not exactly sure of reaction. Has patient had a PCN reaction causing immediate rash, facial/tongue/throat swelling, SOB or lightheadedness with hypotension: Unknown Has patient had a PCN reaction causing severe rash involving mucus membranes or skin necrosis: Unknown Has patient had a PCN reaction that required hospitalization: Unknown Has patient had a PCN reaction occurring within the last 10 years: No If all of the above answers are "NO", then may pr        Medication List        Accurate as of Feb 24, 2023 11:59 PM. If you have any questions, ask your nurse or doctor.          ASCORBIC ACID PO Take 1,000 mg by mouth 2 (two) times a week.   baclofen 10 MG tablet Commonly known as: LIORESAL Take 1 tablet (10 mg total) by mouth 3 (three) times daily.   Benefiber Powd Take 1 each by mouth See admin instructions. Take 1 tablespoon by mouth twice daily   Black Cohosh 540 MG Caps Take 540 mg by mouth at bedtime.   CALCIUM 500 + D PO Take 1 tablet by mouth every 7 (seven) days.   Cholecalciferol 50 MCG (2000 UT) Caps Take 2,000 Units by mouth every 7 (seven) days.   diclofenac 75 MG EC tablet Commonly known as: VOLTAREN Take 1 tablet (75 mg total) by mouth 2 (two) times daily.   Magnesium 250 MG Tabs Take 500 mg by mouth daily.   MIDOL COMPLETE PO Take 2 tablets by mouth 2 (two) times daily as needed (menstrual cramps).   MULTIVITAMIN ADULT PO Take 1 tablet by mouth daily.   ondansetron 4 MG tablet Commonly known as: Zofran Take 1 tablet (4 mg total) by mouth every 8 (eight) hours as needed for nausea or vomiting.   polyethylene glycol 17 g packet Commonly known as:  MiraLax Take 17 g by mouth daily. What changed:  how much to take when to take this   Propylene Glycol 0.6 % Soln Place 2 drops into both eyes 2 (two) times daily. Systane high Performance Eye drops   RABEprazole 20 MG tablet Commonly known as: ACIPHEX TAKE 1 TABLET DAILY   rosuvastatin 10 MG tablet Commonly known as: CRESTOR TAKE 1 TABLET DAILY   STRESS B COMPLEX PO Take 1 tablet by mouth daily.   ZYRTEC-D PO Take 1 tablet by mouth daily.         Follow-up: No follow-ups on file.  Mechele Claude, M.D.

## 2023-03-02 ENCOUNTER — Encounter: Payer: Self-pay | Admitting: Family Medicine

## 2023-03-06 ENCOUNTER — Other Ambulatory Visit: Payer: Self-pay | Admitting: Family Medicine

## 2023-03-26 ENCOUNTER — Other Ambulatory Visit: Payer: Self-pay | Admitting: Family Medicine

## 2023-03-26 DIAGNOSIS — M25511 Pain in right shoulder: Secondary | ICD-10-CM | POA: Diagnosis not present

## 2023-03-26 NOTE — Telephone Encounter (Signed)
Called patient to schedule, she is aware that she needs an appt but was driving and is unable to make atm. She will call back

## 2023-03-26 NOTE — Telephone Encounter (Signed)
Please advise on another RF till next OV which should be in Jan. Last OV 10/14/22 RTC 1 yr

## 2023-03-26 NOTE — Telephone Encounter (Signed)
Stacks NTBS in July for 6 mos FU RF sent to pharmacy

## 2023-03-29 MED ORDER — ROSUVASTATIN CALCIUM 10 MG PO TABS: 10.0000 mg | ORAL_TABLET | Freq: Every day | ORAL | 0 refills | Status: DC

## 2023-03-30 ENCOUNTER — Encounter: Payer: Self-pay | Admitting: Family Medicine

## 2023-04-01 ENCOUNTER — Other Ambulatory Visit: Payer: Self-pay | Admitting: Obstetrics and Gynecology

## 2023-04-01 DIAGNOSIS — Z1231 Encounter for screening mammogram for malignant neoplasm of breast: Secondary | ICD-10-CM | POA: Diagnosis not present

## 2023-04-01 DIAGNOSIS — Z6821 Body mass index (BMI) 21.0-21.9, adult: Secondary | ICD-10-CM | POA: Diagnosis not present

## 2023-04-01 DIAGNOSIS — Z01419 Encounter for gynecological examination (general) (routine) without abnormal findings: Secondary | ICD-10-CM | POA: Diagnosis not present

## 2023-04-01 LAB — HM MAMMOGRAPHY

## 2023-04-23 DIAGNOSIS — M7521 Bicipital tendinitis, right shoulder: Secondary | ICD-10-CM | POA: Diagnosis not present

## 2023-04-23 DIAGNOSIS — M25511 Pain in right shoulder: Secondary | ICD-10-CM | POA: Diagnosis not present

## 2023-04-27 ENCOUNTER — Ambulatory Visit: Payer: BC Managed Care – PPO | Admitting: Family Medicine

## 2023-04-28 ENCOUNTER — Encounter: Payer: Self-pay | Admitting: Family Medicine

## 2023-04-28 ENCOUNTER — Ambulatory Visit (INDEPENDENT_AMBULATORY_CARE_PROVIDER_SITE_OTHER): Payer: BC Managed Care – PPO | Admitting: Family Medicine

## 2023-04-28 VITALS — BP 125/70 | HR 98 | Temp 97.8°F | Ht 64.0 in | Wt 123.4 lb

## 2023-04-28 DIAGNOSIS — R079 Chest pain, unspecified: Secondary | ICD-10-CM

## 2023-04-28 DIAGNOSIS — Z23 Encounter for immunization: Secondary | ICD-10-CM | POA: Diagnosis not present

## 2023-04-28 MED ORDER — QUETIAPINE FUMARATE 25 MG PO TABS: 25.0000 mg | ORAL_TABLET | Freq: Every day | ORAL | 1 refills | Status: DC

## 2023-04-28 MED ORDER — ROSUVASTATIN CALCIUM 10 MG PO TABS: 10.0000 mg | ORAL_TABLET | Freq: Every day | ORAL | 0 refills | Status: DC

## 2023-04-28 NOTE — Progress Notes (Signed)
Subjective:  Patient ID: Lynn Fernandez, female    DOB: [birth year]  Age: 57 y.o. MRN: [id number]  CC: Dizziness and Chest Pain   HPI ASHANTIA AMARAL presents for chest pain at left sternal border. Had started with dizziness on 7/12. No spinning but massive pressure in head. Lasted all evening. Awoke next am worse - lightheaded. No syncope. Chest pain felt like gas. LLSB. Pain was steady and kept pushing. Right shoulder hurt, but it is chronic. No vomiting. Had nausea only. Felt cold a time or two. (Usually hot due to menopause) Slight sweats. No dyspnea. Has HA today.      04/28/2023    2:39 PM 02/24/2023    2:54 PM 02/19/2023    3:14 PM  Depression screen PHQ 2/9  Decreased Interest 0 0 0  Down, Depressed, Hopeless 0 0 0  PHQ - 2 Score 0 0 0      04/28/2023    2:49 PM 10/14/2022   10:45 AM 10/01/2021    3:49 PM  GAD 7 : Generalized Anxiety Score  Nervous, Anxious, on Edge 1 0 0  Control/stop worrying 0 0 0  Worry too much - different things 1 1 0  Trouble relaxing 1 1 0  Restless 1 0 0  Easily annoyed or irritable 0 1 1  Afraid - awful might happen 0 0 0  Total GAD 7 Score 4 3 1   Anxiety Difficulty Not difficult at all  Not difficult at all   Pt. States she worries a lot because work is very complicated and busy.   History Jaspreet has a past medical history of Allergic rhinitis, Dyslipidemia, GERD (gastroesophageal reflux disease), and Hyperlipidemia.   She has a past surgical history that includes pyloric stenosis; Esophagogastroduodenoscopy (10/05); Esophagogastroduodenoscopy (egd) with propofol (N/A, 02/11/2018); Colonoscopy with propofol (N/A, 02/11/2018); and biopsy (02/11/2018).   Her family history includes Alcohol abuse in her father; Breast cancer in her maternal grandmother; Cancer in her paternal grandmother; Diabetes in her paternal uncle; Heart disease in her father, maternal grandfather, and paternal uncle; Hyperlipidemia in her father; Prostate cancer in her  father.She reports that she has never smoked. She has never used smokeless tobacco. She reports current alcohol use of about 3.0 standard drinks of alcohol per week. She reports that she does not use drugs.    ROS Review of Systems  Constitutional: Negative.   HENT: Negative.    Respiratory:  Negative for shortness of breath.   Cardiovascular:  Positive for chest pain.  Gastrointestinal:  Positive for nausea. Negative for abdominal pain.  Neurological:  Positive for dizziness and headaches.  Psychiatric/Behavioral:  Positive for agitation. The patient is nervous/anxious.     Objective:  BP 125/70   Pulse 98   Temp 97.8 F (36.6 C)   Ht 5\' 4"  (1.626 m)   Wt 123 lb 6.4 oz (56 kg)   SpO2 99%   BMI 21.18 kg/m   BP Readings from Last 3 Encounters:  04/28/23 125/70  02/24/23 (!) 147/82  02/19/23 (!) 154/78    Wt Readings from Last 3 Encounters:  04/28/23 123 lb 6.4 oz (56 kg)  02/24/23 125 lb 12.8 oz (57.1 kg)  02/19/23 127 lb (57.6 kg)     Physical Exam Constitutional:      General: She is not in acute distress.    Appearance: She is well-developed.  Cardiovascular:     Rate and Rhythm: Normal rate and regular rhythm.  Pulmonary:     Breath  sounds: Normal breath sounds.  Musculoskeletal:        General: Normal range of motion.  Skin:    General: Skin is warm and dry.  Neurological:     Mental Status: She is alert and oriented to person, place, and time.       Assessment & Plan:   Zamzam was seen today for dizziness and chest pain.  Diagnoses and all orders for this visit:  Chest pain, unspecified type -     EKG 12-Lead  Need for shingles vaccine -     Zoster Recombinant (Shingrix )  Other orders -     QUEtiapine (SEROQUEL) 25 MG tablet; Take 1 tablet (25 mg total) by mouth at bedtime. -     rosuvastatin (CRESTOR) 10 MG tablet; Take 1 tablet (10 mg total) by mouth daily.       I am having Silas [place]. Linton-Hall start on QUEtiapine. I am also  having her maintain her ASCORBIC ACID PO, Acetaminophen-Caff-Pyrilamine (MIDOL COMPLETE PO), Magnesium, Multiple Vitamins-Minerals (MULTIVITAMIN ADULT PO), Cetirizine-Pseudoephedrine (ZYRTEC-D PO), Benefiber, polyethylene glycol, Calcium Carbonate-Vitamin D (CALCIUM 500 + D PO), Cholecalciferol, Black Cohosh, Propylene Glycol, B Complex-C-Folic Acid (STRESS B COMPLEX PO), ondansetron, diclofenac, baclofen, RABEprazole, and rosuvastatin.  Allergies as of 04/28/2023       Reactions   Latex Other (See Comments)   Burning sensation    Penicillins Other (See Comments)   Patient states that it was high doses. She is not exactly sure of reaction. Has patient had a PCN reaction causing immediate rash, facial/tongue/throat swelling, SOB or lightheadedness with hypotension: Unknown Has patient had a PCN reaction causing severe rash involving mucus membranes or skin necrosis: Unknown Has patient had a PCN reaction that required hospitalization: Unknown Has patient had a PCN reaction occurring within the last 10 years: No If all of the above answers are "NO", then may pr        Medication List        Accurate as of April 28, 2023  9:50 PM. If you have any questions, ask your nurse or doctor.          ASCORBIC ACID PO Take 1,000 mg by mouth 2 (two) times a week.   baclofen 10 MG tablet Commonly known as: LIORESAL Take 1 tablet (10 mg total) by mouth 3 (three) times daily.   Benefiber Powd Take 1 each by mouth See admin instructions. Take 1 tablespoon by mouth twice daily   Black Cohosh 540 MG Caps Take 540 mg by mouth at bedtime.   CALCIUM 500 + D PO Take 1 tablet by mouth every 7 (seven) days.   Cholecalciferol 50 MCG (2000 UT) Caps Take 2,000 Units by mouth every 7 (seven) days.   diclofenac 75 MG EC tablet Commonly known as: VOLTAREN Take 1 tablet (75 mg total) by mouth 2 (two) times daily.   Magnesium 250 MG Tabs Take 500 mg by mouth daily.   MIDOL COMPLETE PO Take 2  tablets by mouth 2 (two) times daily as needed (menstrual cramps).   MULTIVITAMIN ADULT PO Take 1 tablet by mouth daily.   ondansetron 4 MG tablet Commonly known as: Zofran Take 1 tablet (4 mg total) by mouth every 8 (eight) hours as needed for nausea or vomiting.   polyethylene glycol 17 g packet Commonly known as: MiraLax Take 17 g by mouth daily. What changed:  how much to take when to take this   Propylene Glycol 0.6 % Soln Place 2 drops into  both eyes 2 (two) times daily. Systane high [employer] Eye drops   QUEtiapine 25 MG tablet Commonly known as: SEROQUEL Take 1 tablet (25 mg total) by mouth at bedtime. Started by: Mirissa Lopresti   RABEprazole 20 MG tablet Commonly known as: ACIPHEX TAKE 1 TABLET DAILY   rosuvastatin 10 MG tablet Commonly known as: CRESTOR Take 1 tablet (10 mg total) by mouth daily.   STRESS B COMPLEX PO Take 1 tablet by mouth daily.   ZYRTEC-D PO Take 1 tablet by mouth daily.         Follow-up: Return in about 1 month (around 05/29/2023).  Mechele Claude, M.D.

## 2023-04-30 ENCOUNTER — Telehealth: Payer: Self-pay | Admitting: Family Medicine

## 2023-05-10 ENCOUNTER — Ambulatory Visit: Payer: BC Managed Care – PPO | Admitting: Family Medicine

## 2023-05-12 ENCOUNTER — Ambulatory Visit: Payer: BC Managed Care – PPO | Admitting: Family Medicine

## 2023-05-26 ENCOUNTER — Telehealth: Payer: Self-pay | Admitting: Family Medicine

## 2023-05-26 NOTE — Telephone Encounter (Signed)
She'll have to discontinue the medication. Follow up with me when the drowsiness effect wears off if a replacement is needed.

## 2023-05-26 NOTE — Telephone Encounter (Signed)
Left message making pt aware and to call back and schedule an appt with Dr. Darlyn Read.

## 2023-06-15 ENCOUNTER — Other Ambulatory Visit: Payer: Self-pay | Admitting: Family Medicine

## 2023-09-02 ENCOUNTER — Other Ambulatory Visit: Payer: Self-pay | Admitting: Family Medicine

## 2023-10-01 DIAGNOSIS — H43811 Vitreous degeneration, right eye: Secondary | ICD-10-CM | POA: Diagnosis not present

## 2023-10-01 DIAGNOSIS — H2513 Age-related nuclear cataract, bilateral: Secondary | ICD-10-CM | POA: Diagnosis not present

## 2023-10-01 DIAGNOSIS — H43391 Other vitreous opacities, right eye: Secondary | ICD-10-CM | POA: Diagnosis not present

## 2023-10-01 DIAGNOSIS — H35371 Puckering of macula, right eye: Secondary | ICD-10-CM | POA: Diagnosis not present

## 2023-10-02 ENCOUNTER — Other Ambulatory Visit: Payer: Self-pay | Admitting: Family Medicine

## 2023-10-26 ENCOUNTER — Other Ambulatory Visit: Payer: Self-pay | Admitting: Family Medicine

## 2023-10-26 NOTE — Telephone Encounter (Signed)
 Copied from CRM [id number]. Topic: Clinical - Medication Refill >> Oct 26, 2023  4:51 PM Zebedee SAUNDERS wrote: Most Recent Primary Care Visit:  Provider: ZOLLIE LOWERS  Department: [facility]  Visit Type: OFFICE VISIT  Date: 04/28/2023  Medication: ***  Has the patient contacted their pharmacy?  (Agent: If no, request that the patient contact the pharmacy for the refill. If patient does not wish to contact the pharmacy document the reason why and proceed with request.) (Agent: If yes, when and what did the pharmacy advise?)  Is this the correct pharmacy for this prescription?  If no, delete pharmacy and type the correct one.  This is the patient's preferred pharmacy:  [facility] - [place], [place] - [facility] AT Portal to Registered [employer] Sites [address] [place] [address] Phone: [phone] Fax: [phone]  [id number] [place], [place] - [address] [place] [address] Phone: [phone] Fax: [phone]   Has the prescription been filled recently?   Is the patient out of the medication?   Has the patient been seen for an appointment in the last year OR does the patient have an upcoming appointment?   Can we respond through MyChart?   Agent: Please be advised that Rx refills may take up to 3 business days. We ask that you follow-up with your pharmacy.

## 2023-10-29 ENCOUNTER — Encounter: Payer: Self-pay | Admitting: Family Medicine

## 2023-11-01 ENCOUNTER — Other Ambulatory Visit: Payer: Self-pay | Admitting: Family Medicine

## 2023-11-01 NOTE — Telephone Encounter (Signed)
Copied from CRM [id number]. Topic: Clinical - Medication Refill >> Nov 01, 2023  5:55 PM Hector Shade B wrote: Most Recent Primary Care Visit:  Provider: Mechele Claude  Department: [facility]  Visit Type: OFFICE VISIT  Date: 04/28/2023  Medication:  RABEprazole (ACIPHEX) 20 MG tablet   Has the patient contacted their pharmacy? Yes (Agent: If no, request that the patient contact the pharmacy for the refill. If patient does not wish to contact the pharmacy document the reason why and proceed with request.) (Agent: If yes, when and what did the pharmacy advise?)  Is this the correct pharmacy for this prescription? Yes for this particular prescription only If no, delete pharmacy and type the correct one.  This is the patient's preferred pharmacy:   [id number] [place], [place] - [address] [place] [address] Phone: [phone] Fax: [phone]    [facility] - [place], [place] - [facility] AT Portal to Registered Caremark Sites [address] [place] [address] Phone: [phone] Fax: [phone]   Has the prescription been filled recently? Yes  Is the patient out of the medication? Yes  Has the patient been seen for an appointment in the last year OR does the patient have an upcoming appointment? Yes  Can we respond through MyChart? Yes  Agent: Please be advised that Rx refills may take up to 3 business days. We ask that you follow-up with your pharmacy.     [facility] [place]

## 2023-11-02 ENCOUNTER — Other Ambulatory Visit: Payer: Self-pay | Admitting: *Deleted

## 2023-11-02 MED ORDER — RABEPRAZOLE SODIUM 20 MG PO TBEC: 20.0000 mg | DELAYED_RELEASE_TABLET | Freq: Every day | ORAL | 3 refills | Status: DC

## 2023-11-22 ENCOUNTER — Ambulatory Visit (INDEPENDENT_AMBULATORY_CARE_PROVIDER_SITE_OTHER): Payer: BC Managed Care – PPO | Admitting: Nurse Practitioner

## 2023-11-22 ENCOUNTER — Other Ambulatory Visit: Payer: Self-pay | Admitting: Family Medicine

## 2023-11-22 VITALS — BP 153/90 | HR 73 | Temp 97.9°F | Ht 64.0 in | Wt 128.0 lb

## 2023-11-22 DIAGNOSIS — J0110 Acute frontal sinusitis, unspecified: Secondary | ICD-10-CM

## 2023-11-22 DIAGNOSIS — R051 Acute cough: Secondary | ICD-10-CM

## 2023-11-22 MED ORDER — AZITHROMYCIN 250 MG PO TABS: ORAL_TABLET | ORAL | 0 refills | Status: DC

## 2023-11-22 MED ORDER — BENZONATATE 100 MG PO CAPS: 100.0000 mg | ORAL_CAPSULE | Freq: Three times a day (TID) | ORAL | 0 refills | Status: DC | PRN

## 2023-11-22 NOTE — Telephone Encounter (Signed)
 Copied from CRM [id number]. Topic: Clinical - Medication Refill >> Nov 22, 2023  5:11 PM Felizardo Hotter wrote: Most Recent Primary Care Visit:  Provider: [facility] [place], [place]  Department: [facility]  Visit Type: OFFICE VISIT  Date: 11/22/2023  Medication: azithromycin  (ZITHROMAX  Z-PAK) 250 MG tablet  Has the patient contacted their pharmacy? Yes (Agent: If no, request that the patient contact the pharmacy for the refill. If patient does not wish to contact the pharmacy document the reason why and proceed with request.) (Agent: If yes, when and what did the pharmacy advise?)  Is this the correct pharmacy for this prescription? Yes If no, delete pharmacy and type the correct one.  This is the patient's preferred pharmacy: Pharmacy states have not received script.    [id number] [place], [place] - [address] [place] [address] Phone: [phone] Fax: [phone]   Has the prescription been filled recently? Yes  Is the patient out of the medication? Yes  Has the patient been seen for an appointment in the last year OR does the patient have an upcoming appointment? Yes  Can we respond through MyChart? Yes  Agent: Please be advised that Rx refills may take up to 3 business days. We ask that you follow-up with your pharmacy.

## 2023-11-22 NOTE — Progress Notes (Signed)
 Established Patient Office Visit  Subjective  Patient ID: Lynn Fernandez, female    DOB: Mar 26, 1966  Age: 58 y.o. MRN: [id number]  Chief Complaint  Patient presents with   Sinusitis    Started Thursday, w/ HA w/ phlegm   Cough    Some green phlegm, mucinex cold & flu, dayquil    Fatigue    HPI Lynn Fernandez presents 11/22/2023 for an acute visit c/o of cough, sinus congest and cough for days. " My assistant was x with Flu A". Denies fever, myalgia  Sinus Pain: Patient complains of congestion, nasal congestion, and productive cough with  green colored sputum. Symptoms include congestion, facial pain, and nasal congestion with no fever, chills, night sweats or weight loss. Onset of symptoms was 6 days ago, unchanged since that time. She is drinking plenty of fluids.  Past history is significant for no history of pneumonia or bronchitis. Patient is non-smoker    Patient Active Problem List   Diagnosis Date Noted   Eddie Good Quervain's disease (tenosynovitis) 10/14/2022   Gastric wall thickening 01/20/2018   Constipation 11/30/2017   Special screening for malignant neoplasms, colon 11/30/2017   GERD (gastroesophageal reflux disease) 03/31/2012   Allergic rhinitis, seasonal 03/31/2012   Hyperlipidemia with target LDL less than 100 03/31/2012   Past Medical History:  Diagnosis Date   Allergic rhinitis    Dyslipidemia    GERD (gastroesophageal reflux disease)    Hyperlipidemia    Past Surgical History:  Procedure Laterality Date   BIOPSY  02/11/2018   Procedure: BIOPSY;  Surgeon: Ruby Corporal, MD;  [facility];  Service: Endoscopy;;  gastric;  colon   COLONOSCOPY WITH PROPOFOL  N/A 02/11/2018   Procedure: COLONOSCOPY WITH PROPOFOL ;  Surgeon: Ruby Corporal, MD;  Location: [facility];  Service: Endoscopy;  Laterality: N/A;   ESOPHAGOGASTRODUODENOSCOPY  10/05   ESOPHAGOGASTRODUODENOSCOPY (EGD) WITH PROPOFOL  N/A 02/11/2018   Procedure: ESOPHAGOGASTRODUODENOSCOPY  (EGD) WITH PROPOFOL ;  Surgeon: Ruby Corporal, MD;  Location: [facility];  Service: Endoscopy;  Laterality: N/A;  730   pyloric stenosis     Social History   Tobacco Use   Smoking status: Never   Smokeless tobacco: Never  Vaping Use   Vaping status: Never Used  Substance Use Topics   Alcohol use: Yes    Alcohol/week: 3.0 standard drinks of alcohol    Types: 3 Glasses of wine per week   Drug use: No   Social History   Socioeconomic History   Marital status: Married    Spouse name: Not on file   Number of children: Not on file   Years of education: Not on file   Highest education level: Bachelor's degree (e.g., BA, AB, BS)  Occupational History   Not on file  Tobacco Use   Smoking status: Never   Smokeless tobacco: Never  Vaping Use   Vaping status: Never Used  Substance and Sexual Activity   Alcohol use: Yes    Alcohol/week: 3.0 standard drinks of alcohol    Types: 3 Glasses of wine per week   Drug use: No   Sexual activity: Yes  Other Topics Concern   Not on file  Social History Narrative   Not on file   Social Drivers of Health   Financial Resource Strain: Low Risk  (11/22/2023)   Overall Financial Resource Strain (CARDIA)    Difficulty of Paying Living Expenses: Not very hard  Food Insecurity: No Food Insecurity (11/22/2023)   Hunger  Vital Sign    Worried About Programme researcher, broadcasting/film/video in the Last Year: Never true    Ran Out of Food in the Last Year: Never true  Transportation Needs: No Transportation Needs (11/22/2023)   PRAPARE - Administrator, Civil Service (Medical): No    Lack of Transportation (Non-Medical): No  Physical Activity: Unknown (11/22/2023)   Exercise Vital Sign    Days of Exercise per Week: Patient declined    Minutes of Exercise per Session: 10 min  Stress: Stress Concern Present (11/22/2023)   [employer] of Occupational Health - Occupational Stress Questionnaire    Feeling of Stress : To some extent  Social Connections:  Unknown (11/22/2023)   Social Connection and Isolation Panel [NHANES]    Frequency of Communication with Friends and Family: More than three times a week    Frequency of Social Gatherings with Friends and Family: Once a week    Attends Religious Services: Patient declined    Database administrator or Organizations: No    Attends Engineer, structural: Not on file    Marital Status: Married  Intimate Partner Violence: Unknown (03/26/2023)   Received from [employer], [facility]   HITS    Physically Hurt: Not on file    Insult or Talk Down To: Not on file    Threaten Physical Harm: Not on file    Scream or Curse: Not on file   Family Status  Relation Name Status   Mother  Alive   Father  Alive   Sister  Alive   Nutritional therapist  (Not Specified)   MGF  Deceased   PGM  (Not Specified)   MGM  Deceased  No partnership data on file   Family History  Problem Relation Age of Onset   Heart disease Father    Hyperlipidemia Father    Alcohol abuse Father    Prostate cancer Father    Diabetes Paternal Uncle    Heart disease Paternal Uncle    Heart disease Maternal Grandfather    Cancer Paternal Grandmother    Breast cancer Maternal Grandmother    Allergies  Allergen Reactions   Latex Other (See Comments)    Burning sensation    Penicillins Other (See Comments)    Patient states that it was high doses. She is not exactly sure of reaction. Has patient had a PCN reaction causing immediate rash, facial/tongue/throat swelling, SOB or lightheadedness with hypotension: Unknown Has patient had a PCN reaction causing severe rash involving mucus membranes or skin necrosis: Unknown Has patient had a PCN reaction that required hospitalization: Unknown Has patient had a PCN reaction occurring within the last 10 years: No If all of the above answers are "NO", then may pr      ROS Negative unless indicated in HPI   Objective:     BP (!) 153/90   Pulse 73   Temp 97.9 F (36.6 C)  (Temporal)   Ht 5\' 4"  (1.626 m)   Wt 128 lb (58.1 kg)   SpO2 95%   BMI 21.97 kg/m  BP Readings from Last 3 Encounters:  11/22/23 (!) 153/90  04/28/23 125/70  02/24/23 (!) 147/82   Wt Readings from Last 3 Encounters:  11/22/23 128 lb (58.1 kg)  04/28/23 123 lb 6.4 oz (56 kg)  02/24/23 125 lb 12.8 oz (57.1 kg)      Physical Exam Vitals and nursing note reviewed.  Constitutional:      Appearance: Normal  appearance.  HENT:     Head: Normocephalic and atraumatic.     Right Ear: Tympanic membrane, ear canal and external ear normal. There is no impacted cerumen.     Left Ear: Tympanic membrane, ear canal and external ear normal. There is impacted cerumen.     Nose:     Right Sinus: Frontal sinus tenderness present.     Left Sinus: Frontal sinus tenderness present.     Mouth/Throat:     Mouth: Mucous membranes are moist.  Eyes:     General: No scleral icterus.    Extraocular Movements: Extraocular movements intact.     Conjunctiva/sclera: Conjunctivae normal.     Pupils: Pupils are equal, round, and reactive to light.  Neck:     Vascular: No carotid bruit.  Cardiovascular:     Rate and Rhythm: Normal rate and regular rhythm.  Pulmonary:     Effort: Pulmonary effort is normal.     Breath sounds: Normal breath sounds. No wheezing or rhonchi.  Musculoskeletal:        General: Normal range of motion.     Cervical back: Normal range of motion and neck supple. No rigidity or tenderness.     Right lower leg: No edema.     Left lower leg: No edema.  Lymphadenopathy:     Cervical: No cervical adenopathy.  Skin:    General: Skin is warm and dry.     Findings: No rash.  Neurological:     Mental Status: She is alert and oriented to person, place, and time. Mental status is at baseline.  Psychiatric:        Mood and Affect: Mood normal.        Behavior: Behavior normal.        Thought Content: Thought content normal.        Judgment: Judgment normal.     No results found for  any visits on 11/22/23.  Last CBC Lab Results  Component Value Date   WBC 5.0 10/14/2022   HGB 13.7 10/14/2022   HCT 41.4 10/14/2022   MCV 91 10/14/2022   MCH 30.2 10/14/2022   RDW 12.4 10/14/2022   PLT 347 10/14/2022   Last metabolic panel Lab Results  Component Value Date   GLUCOSE 88 10/14/2022   NA 142 10/14/2022   K 4.5 10/14/2022   CL 102 10/14/2022   CO2 24 10/14/2022   BUN 12 10/14/2022   CREATININE 0.74 10/14/2022   EGFR 95 10/14/2022   CALCIUM  9.8 10/14/2022   PROT 7.3 10/14/2022   ALBUMIN 4.7 10/14/2022   LABGLOB 2.6 10/14/2022   AGRATIO 1.8 10/14/2022   BILITOT 0.9 10/14/2022   ALKPHOS 40 (L) 10/14/2022   AST 23 10/14/2022   ALT 26 10/14/2022   ANIONGAP 11 02/07/2018   Last lipids Lab Results  Component Value Date   CHOL 342 (H) 10/01/2021   HDL 81 10/01/2021   LDLCALC 181 (H) 10/01/2021   TRIG 405 (H) 10/01/2021   CHOLHDL 4.2 10/01/2021   Last hemoglobin A1c No results found for: "HGBA1C" Last thyroid functions Lab Results  Component Value Date   TSH 2.430 11/05/2016        Assessment & Plan:  Acute non-recurrent frontal sinusitis -     Azithromycin ; 2 tabs on the first day and 1-tab until done  Dispense: 6 tablet; Refill: 0  Acute cough -     Benzonatate ; Take 1 capsule (100 mg total) by mouth 3 (three) times daily as needed.  Dispense: 20 capsule; Refill: 0   Aracelli is a 58 year old Caucasian female seen today for sinusitis, no acute distress Zithromax  number still 15 mg #6 dispense client instructed to take 2 tablets on day 1 and 1 tablet until done Cough Tessalon  Perle 100 mg 1 tablet 3 times daily take with at least 8 ounces of water  Okay to OTC saline rinse  Encourage  healthy lifestyle choices, including diet (rich in fruits, vegetables, and lean proteins, and low in salt and simple carbohydrates) and exercise (at least 30 minutes of moderate physical activity daily).     The above assessment and management plan was discussed  with the patient. The patient verbalized understanding of and has agreed to the management plan. Patient is aware to call the clinic if they develop any new symptoms or if symptoms persist or worsen. Patient is aware when to return to the clinic for a follow-up visit. Patient educated on when it is appropriate to go to the emergency department.  Return if symptoms worsen or fail to improve.    Sibyl Mikula [place] Thompson, DNP [facility] [address] [place], [place] [address] [phone]    Note: This document was prepared by [employer] voice dictation technology and any errors that results from this process are unintentional.

## 2023-11-22 NOTE — Telephone Encounter (Signed)
 No further action needed. Medication verified rcvd by pharmacy.

## 2023-11-29 ENCOUNTER — Ambulatory Visit: Payer: BC Managed Care – PPO | Admitting: Family Medicine

## 2023-12-09 ENCOUNTER — Ambulatory Visit (INDEPENDENT_AMBULATORY_CARE_PROVIDER_SITE_OTHER): Payer: BC Managed Care – PPO

## 2023-12-09 DIAGNOSIS — Z23 Encounter for immunization: Secondary | ICD-10-CM | POA: Diagnosis not present

## 2023-12-09 NOTE — Progress Notes (Signed)
 Patient is in office today for a nurse visit for shingrix Immunization. Patient Injection was given in the  Left deltoid. Patient tolerated injection well.

## 2023-12-24 DIAGNOSIS — M25511 Pain in right shoulder: Secondary | ICD-10-CM | POA: Diagnosis not present

## 2023-12-24 DIAGNOSIS — M25562 Pain in left knee: Secondary | ICD-10-CM | POA: Diagnosis not present

## 2023-12-24 DIAGNOSIS — M7521 Bicipital tendinitis, right shoulder: Secondary | ICD-10-CM | POA: Diagnosis not present

## 2023-12-30 ENCOUNTER — Ambulatory Visit: Payer: BC Managed Care – PPO | Admitting: Family Medicine

## 2023-12-30 DIAGNOSIS — H16012 Central corneal ulcer, left eye: Secondary | ICD-10-CM | POA: Diagnosis not present

## 2024-01-11 ENCOUNTER — Encounter: Payer: Self-pay | Admitting: Family Medicine

## 2024-01-11 ENCOUNTER — Ambulatory Visit (INDEPENDENT_AMBULATORY_CARE_PROVIDER_SITE_OTHER): Payer: BC Managed Care – PPO | Admitting: Family Medicine

## 2024-01-11 VITALS — BP 123/73 | HR 82 | Temp 97.2°F | Ht 64.0 in | Wt 124.0 lb

## 2024-01-11 DIAGNOSIS — F411 Generalized anxiety disorder: Secondary | ICD-10-CM | POA: Diagnosis not present

## 2024-01-11 DIAGNOSIS — E785 Hyperlipidemia, unspecified: Secondary | ICD-10-CM | POA: Diagnosis not present

## 2024-01-11 DIAGNOSIS — K219 Gastro-esophageal reflux disease without esophagitis: Secondary | ICD-10-CM | POA: Diagnosis not present

## 2024-01-11 DIAGNOSIS — F430 Acute stress reaction: Secondary | ICD-10-CM

## 2024-01-11 NOTE — Progress Notes (Signed)
 Subjective:  Patient ID: Lynn Fernandez, female    DOB: Oct 22, 1965  Age: 58 y.o. MRN: [id number]  CC: Medication Refill (I don't see any due) and Shoulder Pain (Seeing orthopedic. Neck and shoulder tension. Wants you to be aware. Believes it is stress related. )   HPI Lynn Fernandez presents for stress and shoulder related to horse riding injury. Horse had twins 2 days ago. One died the other had seizures last night. Lynn Fernandez is pushing $10K. Husband, Lynn Fernandez also under a lot of stress.  Patient is distraught over the horses and the finances as well as her husband stress level.  She is seeing orthopedics for the right shoulder derangement.  She is carrying a lot of tension in her neck and shoulders but declines medicationFor that. Patient in for follow-up of GERD. Currently asymptomatic taking  PPI daily. There is no chest pain or heartburn. No hematemesis and no melena. No dysphagia or choking. Onset is remote. Progression is Fernandez. Complicating factors, none.  Patient in for follow-up of elevated cholesterol. Doing well without complaints on current medication. Denies side effects of statin including myalgia and arthralgia and nausea. Also in today for liver function testing. Currently no chest pain, shortness of breath or other cardiovascular related symptoms noted.    01/11/2024   10:31 AM 11/22/2023    4:13 PM 04/28/2023    2:39 PM  Depression screen PHQ 2/9  Decreased Interest 0 0 0  Down, Depressed, Hopeless 0 0 0  PHQ - 2 Score 0 0 0  Altered sleeping 0    Tired, decreased energy 2    Change in appetite 0    Feeling bad or failure about yourself  0    Trouble concentrating 0    Moving slowly or fidgety/restless 0    Suicidal thoughts 0    PHQ-9 Score 2    Difficult doing work/chores Not difficult at all      History Lynn Fernandez has a past medical history of Allergic rhinitis, Dyslipidemia, GERD (gastroesophageal reflux disease), and Hyperlipidemia.   She has a past surgical  history that includes pyloric stenosis; Esophagogastroduodenoscopy (10/05); Esophagogastroduodenoscopy (egd) with propofol (N/A, 02/11/2018); Colonoscopy with propofol (N/A, 02/11/2018); and biopsy (02/11/2018).   Her family history includes Alcohol abuse in her father; Breast cancer in her maternal grandmother; Cancer in her paternal grandmother; Diabetes in her paternal uncle; Heart disease in her father, maternal grandfather, and paternal uncle; Hyperlipidemia in her father; Prostate cancer in her father.She reports that she has never smoked. She has never used smokeless tobacco. She reports current alcohol use of about 3.0 standard drinks of alcohol per week. She reports that she does not use drugs.    ROS Review of Systems  Constitutional: Negative.   HENT: Negative.    Eyes:  Negative for visual disturbance.  Respiratory:  Negative for shortness of breath.   Cardiovascular:  Negative for chest pain.  Gastrointestinal:  Negative for abdominal pain.  Musculoskeletal:  Negative for arthralgias.    Objective:  BP 123/73   Pulse 82   Temp (!) 97.2 F (36.2 C)   Ht 5\' 4"  (1.626 m)   Wt 124 lb (56.2 kg)   SpO2 98%   BMI 21.28 kg/m   BP Readings from Last 3 Encounters:  01/11/24 123/73  11/22/23 (!) 153/90  04/28/23 125/70    Wt Readings from Last 3 Encounters:  01/11/24 124 lb (56.2 kg)  11/22/23 128 lb (58.1 kg)  04/28/23 123 lb 6.4 oz (56  kg)     Physical Exam Constitutional:      General: She is not in acute distress.    Appearance: Normal appearance. She is well-developed.  Cardiovascular:     Rate and Rhythm: Normal rate and regular rhythm.  Pulmonary:     Breath sounds: Normal breath sounds.  Musculoskeletal:        General: Normal range of motion.  Skin:    General: Skin is warm and dry.  Neurological:     Mental Status: She is alert and oriented to person, place, and time.  Psychiatric:     Comments: Distraught based on situation      Assessment & Plan:   Hyperlipidemia with target LDL less than 100 -     NMR, lipoprofile -     CBC with Differential/Platelet -     CMP14+EGFR  Gastroesophageal reflux disease without esophagitis -     CBC with Differential/Platelet -     CMP14+EGFR  Anxiety as acute reaction to exceptional stress   Patient feels she can distress without medication at this point.  She is caring her stress in her shoulders.  She has some leftover baclofen at home.  She wants to try that to relieve the physical tightness in the shoulders.  Follow-up: Return in about 6 months (around 07/12/2024), or if symptoms worsen or fail to improve.  Mechele Claude, M.D.

## 2024-01-12 ENCOUNTER — Encounter: Payer: Self-pay | Admitting: Family Medicine

## 2024-01-12 LAB — CBC WITH DIFFERENTIAL/PLATELET
Basophils Absolute: 0 10*3/uL (ref 0.0–0.2)
Basos: 1 %
EOS (ABSOLUTE): 0.2 10*3/uL (ref 0.0–0.4)
Eos: 3 %
Hematocrit: 40.5 % (ref 34.0–46.6)
Hemoglobin: 13.6 g/dL (ref 11.1–15.9)
Immature Grans (Abs): 0 10*3/uL (ref 0.0–0.1)
Immature Granulocytes: 0 %
Lymphocytes Absolute: 1.1 10*3/uL (ref 0.7–3.1)
Lymphs: 25 %
MCH: 30.8 pg (ref 26.6–33.0)
MCHC: 33.6 g/dL (ref 31.5–35.7)
MCV: 92 fL (ref 79–97)
Monocytes Absolute: 0.5 10*3/uL (ref 0.1–0.9)
Monocytes: 12 %
Neutrophils Absolute: 2.7 10*3/uL (ref 1.4–7.0)
Neutrophils: 59 %
Platelets: 234 10*3/uL (ref 150–450)
RBC: 4.42 x10E6/uL (ref 3.77–5.28)
RDW: 12.4 % (ref 11.7–15.4)
WBC: 4.6 10*3/uL (ref 3.4–10.8)

## 2024-01-12 LAB — CMP14+EGFR
ALT: 40 IU/L — ABNORMAL HIGH (ref 0–32)
AST: 43 IU/L — ABNORMAL HIGH (ref 0–40)
Albumin: 4.7 g/dL (ref 3.8–4.9)
Alkaline Phosphatase: 33 IU/L — ABNORMAL LOW (ref 44–121)
BUN/Creatinine Ratio: 13 (ref 9–23)
BUN: 9 mg/dL (ref 6–24)
Bilirubin Total: 0.6 mg/dL (ref 0.0–1.2)
CO2: 23 mmol/L (ref 20–29)
Calcium: 9.4 mg/dL (ref 8.7–10.2)
Chloride: 103 mmol/L (ref 96–106)
Creatinine, Ser: 0.72 mg/dL (ref 0.57–1.00)
Globulin, Total: 2.5 g/dL (ref 1.5–4.5)
Glucose: 93 mg/dL (ref 70–99)
Potassium: 4.2 mmol/L (ref 3.5–5.2)
Sodium: 140 mmol/L (ref 134–144)
Total Protein: 7.2 g/dL (ref 6.0–8.5)
eGFR: 97 mL/min/{1.73_m2} (ref >59)

## 2024-01-12 LAB — NMR, LIPOPROFILE
Cholesterol, Total: 236 mg/dL — ABNORMAL HIGH (ref 100–199)
HDL Particle Number: 45.5 umol/L (ref >=30.5)
HDL-C: 94 mg/dL (ref >39)
LDL Particle Number: 1280 nmol/L — ABNORMAL HIGH (ref <1000)
LDL Size: 21.5 nm (ref >20.5)
LDL-C (NIH Calc): 129 mg/dL — ABNORMAL HIGH (ref 0–99)
LP-IR Score: <25 (ref <=45)
Small LDL Particle Number: <90 nmol/L (ref <=527)
Triglycerides: 76 mg/dL (ref 0–149)

## 2024-01-28 IMAGING — MR MR PELVIS WO/W CM
18 series · 48 of 48 positions shown · IV contrast (multihance)
Comparison: None.

CLINICAL DATA: Pelvic mass.  Dyspareunia.

EXAM:
MRI PELVIS WITHOUT AND WITH CONTRAST
TECHNIQUE: Multiplanar multisequence MR imaging of the pelvis was performed
both before and after administration of intravenous contrast.
CONTRAST:  13mL MULTIHANCE GADOBENATE DIMEGLUMINE 529 MG/ML IV SOLN

[Series 3: cor haste (include · coronal · 6.0mm · 0.74mm/px · 1 of 24 slices shown]
[im 1/24]
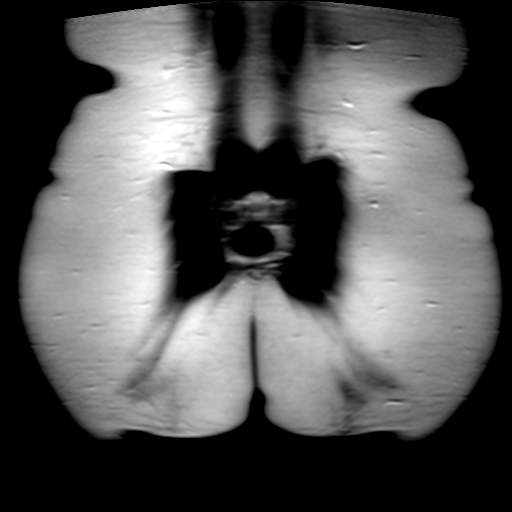

[Series 4: T2 · axial · 5.0mm · 0.94mm/px · 1 of 35 slices shown]
[im 1/35]
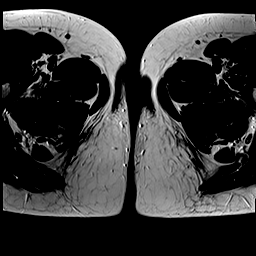

[Series 5: t2_tse axial fs · axial · 5.0mm · 0.94mm/px · 1 of 35 slices shown]
[im 1/35]
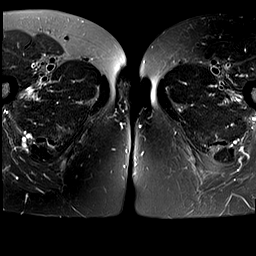

[Series 6: t2_tse_sag · sagittal · 5.0mm · 0.94mm/px · 1 of 25 slices shown (1 of 2)]
[im 1/25]
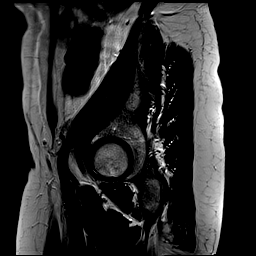

[Series 7: T1 · axial · 5.0mm · 0.55mm/px · z∈[-107,+91]mm · 2 of 74 slices shown]
[im 1/74]
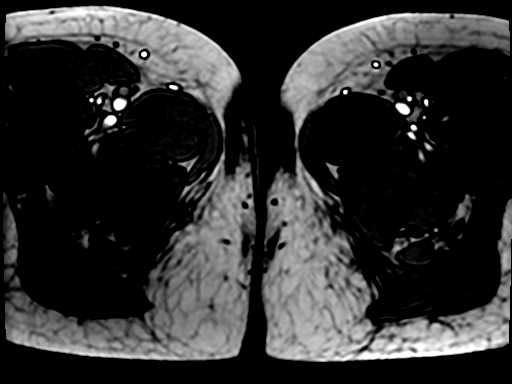
[im 74/74]
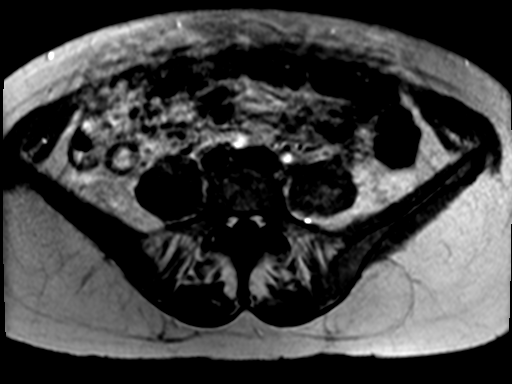

[Series 8: DWI · axial · 6.0mm · 1.82mm/px · z∈[-122,+101]mm · 3 of 96 slices shown]
[im 1/96]
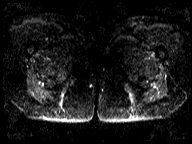
[im 48/96]
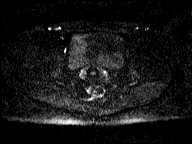
[im 96/96]
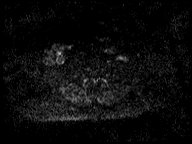

[Series 9: axial dwi_adc · axial · 6.0mm · 1.82mm/px · 1 of 32 slices shown]
[im 1/32]
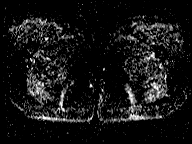

[Series 10: T1 dynamic · axial · non-contrast · 2.3mm · 1.37mm/px · z∈[-108,+92]mm · 3 of 88 slices shown]
[im 1/88]
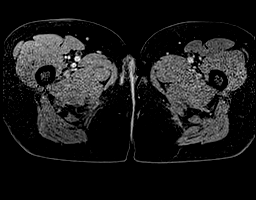
[im 44/88]
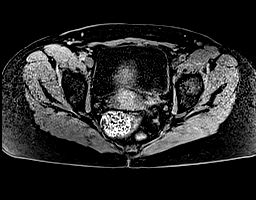
[im 88/88]
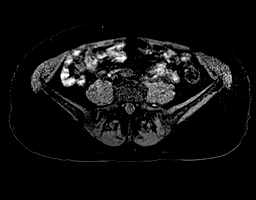

[Series 11: ax post dynamic · axial · 2.3mm · 1.37mm/px · z∈[-108,+92]mm · 4 of 88 slices shown (1 of 2)]
[im 1/88]
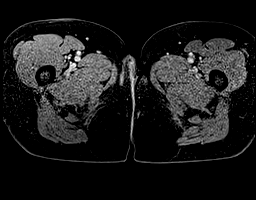
[im 30/88]
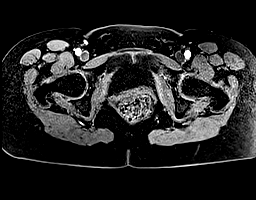
[im 59/88]
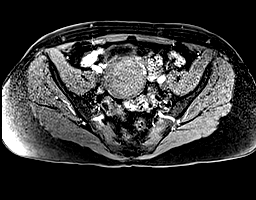
[im 88/88]
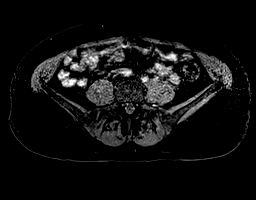

[Series 12: ax post dynamic · axial · 2.3mm · 1.37mm/px · z∈[-108,+92]mm · 4 of 88 slices shown (2 of 2)]
[im 1/88]
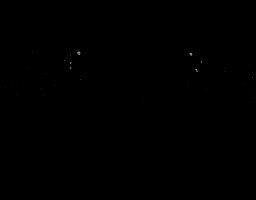
[im 30/88]
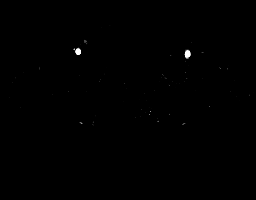
[im 59/88]
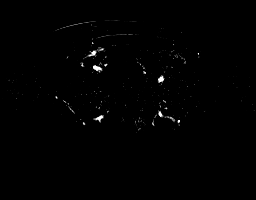
[im 88/88]
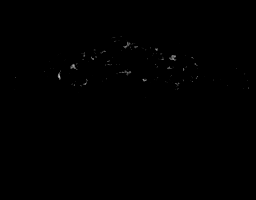

[Series 13: ax post 45 · axial · 2.3mm · 1.37mm/px · z∈[-108,+92]mm · 4 of 88 slices shown (1 of 2)]
[im 1/88]
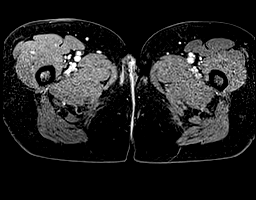
[im 30/88]
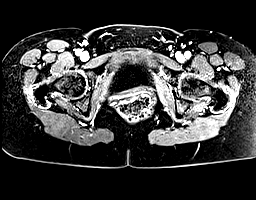
[im 59/88]
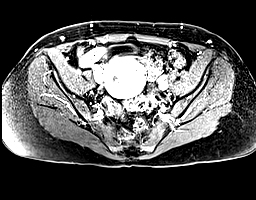
[im 88/88]
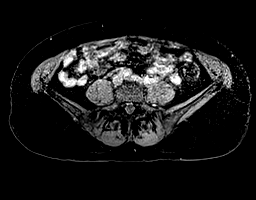

[Series 14: ax post 45 · axial · 2.3mm · 1.37mm/px · z∈[-108,+92]mm · 4 of 88 slices shown (2 of 2)]
[im 1/88]
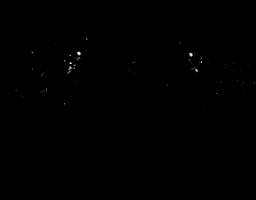
[im 30/88]
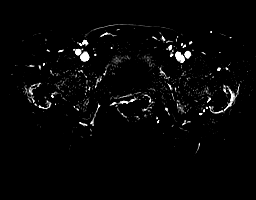
[im 59/88]
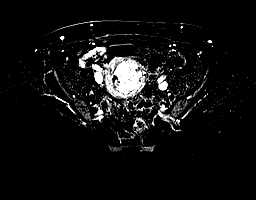
[im 88/88]
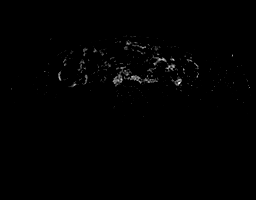

[Series 15: ax post 90 · axial · 2.3mm · 1.37mm/px · z∈[-108,+92]mm · 4 of 88 slices shown (1 of 2)]
[im 1/88]
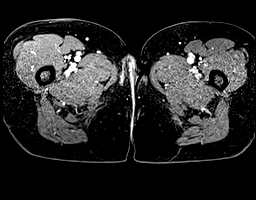
[im 30/88]
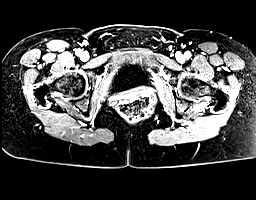
[im 59/88]
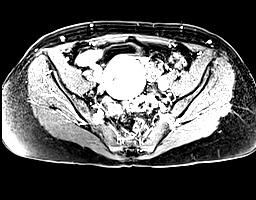
[im 88/88]
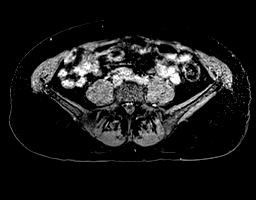

[Series 16: ax post 90 · axial · 2.3mm · 1.37mm/px · z∈[-108,+92]mm · 4 of 88 slices shown (2 of 2)]
[im 1/88]
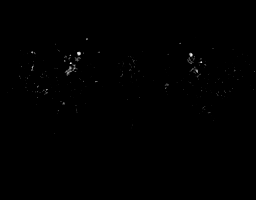
[im 30/88]
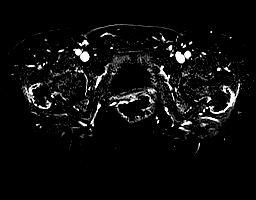
[im 59/88]
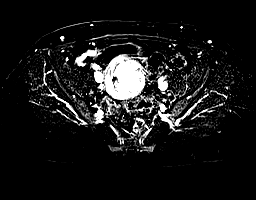
[im 88/88]
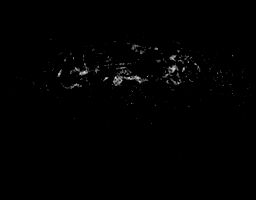

[Series 17: T1 dynamic post-contrast · coronal · 2.5mm · 0.68mm/px · 2 of 60 slices shown]
[im 1/60]
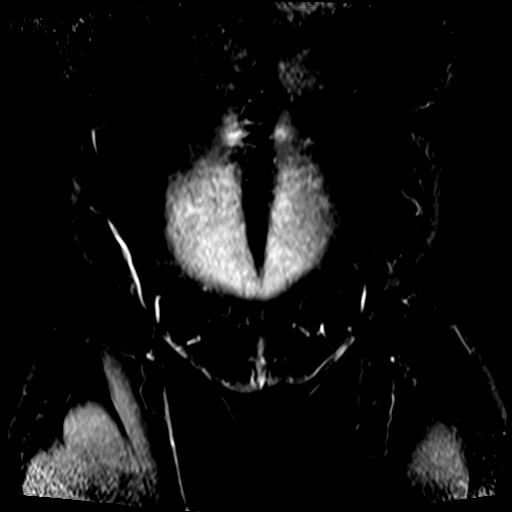
[im 60/60]
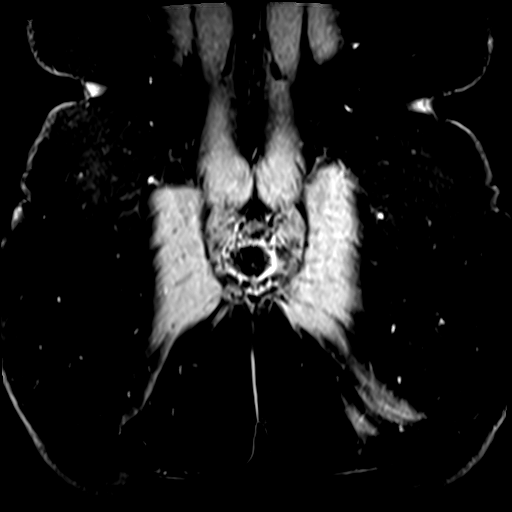

[Series 18: ax post 3+ · axial · 2.3mm · 1.37mm/px · z∈[-108,+92]mm · 4 of 88 slices shown (1 of 2)]
[im 1/88]
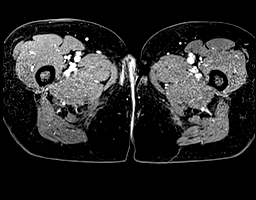
[im 30/88]
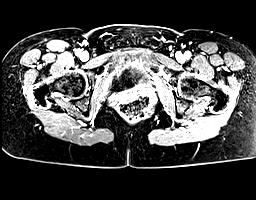
[im 59/88]
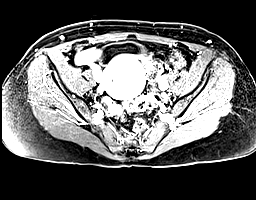
[im 88/88]
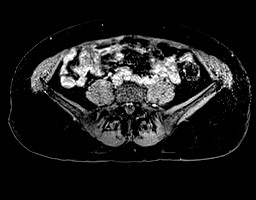

[Series 19: ax post 3+ · axial · 2.3mm · 1.37mm/px · z∈[-108,+92]mm · 4 of 88 slices shown (2 of 2)]
[im 1/88]
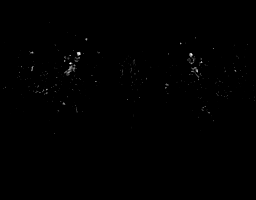
[im 30/88]
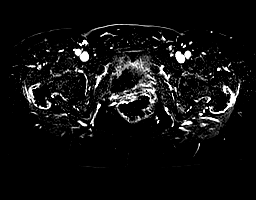
[im 59/88]
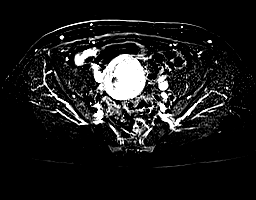
[im 88/88]
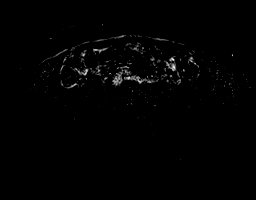

[Series 20: t2_tse_sag · sagittal · 5.0mm · 0.94mm/px · 1 of 25 slices shown (2 of 2)]
[im 1/25]
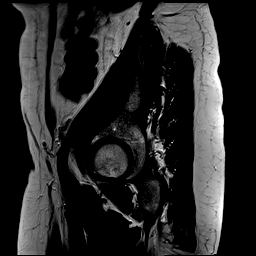

[48 of 48 positions shown; findings below may reference images not displayed]

FINDINGS: Lower Urinary Tract: No urinary bladder or urethral abnormality
identified.

Bowel: Unremarkable pelvic bowel loops.

Vascular/Lymphatic: Unremarkable. No pathologically enlarged pelvic
lymph nodes identified.

Reproductive:

-- Uterus: Measures 8.0 x 6.2 by 6.3 cm (volume = 160 cm^3). A
single central uterine fibroid is seen which is intramural in
location, with submucosal component displacing the endometrium to
the right. This fibroid measures 5.4 x 5.0 by 4.2 cm, and shows
diffuse contrast enhancement but lack of restricted diffusion. No
abnormal endometrial thickening is seen. Cervix and vagina are
unremarkable in appearance.

-- Right ovary: Not visualized, however no adnexal mass identified.

-- Left ovary:  Not visualized, however no adnexal mass identified.

Other: No peritoneal thickening or abnormal free fluid.

Musculoskeletal:  Unremarkable.
IMPRESSION: Single central uterine fibroid measuring 5.4 cm.

Nonvisualization of ovaries, however no adnexal mass identified.

## 2024-01-31 DIAGNOSIS — M7521 Bicipital tendinitis, right shoulder: Secondary | ICD-10-CM | POA: Diagnosis not present

## 2024-02-04 DIAGNOSIS — M75111 Incomplete rotator cuff tear or rupture of right shoulder, not specified as traumatic: Secondary | ICD-10-CM | POA: Diagnosis not present

## 2024-02-15 ENCOUNTER — Encounter: Payer: Self-pay | Admitting: Family Medicine

## 2024-02-21 DIAGNOSIS — M25511 Pain in right shoulder: Secondary | ICD-10-CM | POA: Diagnosis not present

## 2024-02-22 DIAGNOSIS — S43431A Superior glenoid labrum lesion of right shoulder, initial encounter: Secondary | ICD-10-CM | POA: Diagnosis not present

## 2024-02-22 DIAGNOSIS — M25511 Pain in right shoulder: Secondary | ICD-10-CM | POA: Diagnosis not present

## 2024-02-22 DIAGNOSIS — M7541 Impingement syndrome of right shoulder: Secondary | ICD-10-CM | POA: Diagnosis not present

## 2024-02-22 DIAGNOSIS — M65811 Other synovitis and tenosynovitis, right shoulder: Secondary | ICD-10-CM | POA: Diagnosis not present

## 2024-02-22 DIAGNOSIS — M24111 Other articular cartilage disorders, right shoulder: Secondary | ICD-10-CM | POA: Diagnosis not present

## 2024-02-22 DIAGNOSIS — M94211 Chondromalacia, right shoulder: Secondary | ICD-10-CM | POA: Diagnosis not present

## 2024-02-22 DIAGNOSIS — G8918 Other acute postprocedural pain: Secondary | ICD-10-CM | POA: Diagnosis not present

## 2024-02-22 DIAGNOSIS — M75111 Incomplete rotator cuff tear or rupture of right shoulder, not specified as traumatic: Secondary | ICD-10-CM | POA: Diagnosis not present

## 2024-02-22 DIAGNOSIS — M65911 Unspecified synovitis and tenosynovitis, right shoulder: Secondary | ICD-10-CM | POA: Diagnosis not present

## 2024-02-22 DIAGNOSIS — M778 Other enthesopathies, not elsewhere classified: Secondary | ICD-10-CM | POA: Diagnosis not present

## 2024-02-29 DIAGNOSIS — M75111 Incomplete rotator cuff tear or rupture of right shoulder, not specified as traumatic: Secondary | ICD-10-CM | POA: Diagnosis not present

## 2024-02-29 DIAGNOSIS — R6889 Other general symptoms and signs: Secondary | ICD-10-CM | POA: Diagnosis not present

## 2024-03-09 DIAGNOSIS — R6889 Other general symptoms and signs: Secondary | ICD-10-CM | POA: Diagnosis not present

## 2024-03-09 DIAGNOSIS — M75111 Incomplete rotator cuff tear or rupture of right shoulder, not specified as traumatic: Secondary | ICD-10-CM | POA: Diagnosis not present

## 2024-03-13 DIAGNOSIS — M75111 Incomplete rotator cuff tear or rupture of right shoulder, not specified as traumatic: Secondary | ICD-10-CM | POA: Diagnosis not present

## 2024-03-13 DIAGNOSIS — R6889 Other general symptoms and signs: Secondary | ICD-10-CM | POA: Diagnosis not present

## 2024-03-16 DIAGNOSIS — M75111 Incomplete rotator cuff tear or rupture of right shoulder, not specified as traumatic: Secondary | ICD-10-CM | POA: Diagnosis not present

## 2024-03-16 DIAGNOSIS — R6889 Other general symptoms and signs: Secondary | ICD-10-CM | POA: Diagnosis not present

## 2024-03-21 DIAGNOSIS — M75111 Incomplete rotator cuff tear or rupture of right shoulder, not specified as traumatic: Secondary | ICD-10-CM | POA: Diagnosis not present

## 2024-03-21 DIAGNOSIS — R6889 Other general symptoms and signs: Secondary | ICD-10-CM | POA: Diagnosis not present

## 2024-03-24 DIAGNOSIS — R6889 Other general symptoms and signs: Secondary | ICD-10-CM | POA: Diagnosis not present

## 2024-03-24 DIAGNOSIS — M75111 Incomplete rotator cuff tear or rupture of right shoulder, not specified as traumatic: Secondary | ICD-10-CM | POA: Diagnosis not present

## 2024-03-28 DIAGNOSIS — M75111 Incomplete rotator cuff tear or rupture of right shoulder, not specified as traumatic: Secondary | ICD-10-CM | POA: Diagnosis not present

## 2024-03-28 DIAGNOSIS — R6889 Other general symptoms and signs: Secondary | ICD-10-CM | POA: Diagnosis not present

## 2024-03-30 DIAGNOSIS — R6889 Other general symptoms and signs: Secondary | ICD-10-CM | POA: Diagnosis not present

## 2024-03-30 DIAGNOSIS — M75111 Incomplete rotator cuff tear or rupture of right shoulder, not specified as traumatic: Secondary | ICD-10-CM | POA: Diagnosis not present

## 2024-04-04 DIAGNOSIS — R6889 Other general symptoms and signs: Secondary | ICD-10-CM | POA: Diagnosis not present

## 2024-04-04 DIAGNOSIS — M75111 Incomplete rotator cuff tear or rupture of right shoulder, not specified as traumatic: Secondary | ICD-10-CM | POA: Diagnosis not present

## 2024-04-07 DIAGNOSIS — R6889 Other general symptoms and signs: Secondary | ICD-10-CM | POA: Diagnosis not present

## 2024-04-07 DIAGNOSIS — M75111 Incomplete rotator cuff tear or rupture of right shoulder, not specified as traumatic: Secondary | ICD-10-CM | POA: Diagnosis not present

## 2024-04-11 DIAGNOSIS — R6889 Other general symptoms and signs: Secondary | ICD-10-CM | POA: Diagnosis not present

## 2024-04-11 DIAGNOSIS — M75111 Incomplete rotator cuff tear or rupture of right shoulder, not specified as traumatic: Secondary | ICD-10-CM | POA: Diagnosis not present

## 2024-04-19 DIAGNOSIS — R6889 Other general symptoms and signs: Secondary | ICD-10-CM | POA: Diagnosis not present

## 2024-04-19 DIAGNOSIS — M75111 Incomplete rotator cuff tear or rupture of right shoulder, not specified as traumatic: Secondary | ICD-10-CM | POA: Diagnosis not present

## 2024-04-24 DIAGNOSIS — R6889 Other general symptoms and signs: Secondary | ICD-10-CM | POA: Diagnosis not present

## 2024-04-24 DIAGNOSIS — M75111 Incomplete rotator cuff tear or rupture of right shoulder, not specified as traumatic: Secondary | ICD-10-CM | POA: Diagnosis not present

## 2024-04-28 DIAGNOSIS — M75111 Incomplete rotator cuff tear or rupture of right shoulder, not specified as traumatic: Secondary | ICD-10-CM | POA: Diagnosis not present

## 2024-04-28 DIAGNOSIS — R6889 Other general symptoms and signs: Secondary | ICD-10-CM | POA: Diagnosis not present

## 2024-05-02 DIAGNOSIS — R6889 Other general symptoms and signs: Secondary | ICD-10-CM | POA: Diagnosis not present

## 2024-05-02 DIAGNOSIS — M75111 Incomplete rotator cuff tear or rupture of right shoulder, not specified as traumatic: Secondary | ICD-10-CM | POA: Diagnosis not present

## 2024-05-04 DIAGNOSIS — Z1231 Encounter for screening mammogram for malignant neoplasm of breast: Secondary | ICD-10-CM | POA: Diagnosis not present

## 2024-05-04 DIAGNOSIS — Z01419 Encounter for gynecological examination (general) (routine) without abnormal findings: Secondary | ICD-10-CM | POA: Diagnosis not present

## 2024-05-05 DIAGNOSIS — M75111 Incomplete rotator cuff tear or rupture of right shoulder, not specified as traumatic: Secondary | ICD-10-CM | POA: Diagnosis not present

## 2024-05-05 DIAGNOSIS — R6889 Other general symptoms and signs: Secondary | ICD-10-CM | POA: Diagnosis not present

## 2024-05-09 DIAGNOSIS — R6889 Other general symptoms and signs: Secondary | ICD-10-CM | POA: Diagnosis not present

## 2024-05-09 DIAGNOSIS — M75111 Incomplete rotator cuff tear or rupture of right shoulder, not specified as traumatic: Secondary | ICD-10-CM | POA: Diagnosis not present

## 2024-05-12 DIAGNOSIS — M75111 Incomplete rotator cuff tear or rupture of right shoulder, not specified as traumatic: Secondary | ICD-10-CM | POA: Diagnosis not present

## 2024-05-12 DIAGNOSIS — R6889 Other general symptoms and signs: Secondary | ICD-10-CM | POA: Diagnosis not present

## 2024-05-16 DIAGNOSIS — R6889 Other general symptoms and signs: Secondary | ICD-10-CM | POA: Diagnosis not present

## 2024-05-16 DIAGNOSIS — M75111 Incomplete rotator cuff tear or rupture of right shoulder, not specified as traumatic: Secondary | ICD-10-CM | POA: Diagnosis not present

## 2024-05-19 DIAGNOSIS — M75111 Incomplete rotator cuff tear or rupture of right shoulder, not specified as traumatic: Secondary | ICD-10-CM | POA: Diagnosis not present

## 2024-05-19 DIAGNOSIS — R6889 Other general symptoms and signs: Secondary | ICD-10-CM | POA: Diagnosis not present

## 2024-05-23 DIAGNOSIS — R6889 Other general symptoms and signs: Secondary | ICD-10-CM | POA: Diagnosis not present

## 2024-05-23 DIAGNOSIS — M75111 Incomplete rotator cuff tear or rupture of right shoulder, not specified as traumatic: Secondary | ICD-10-CM | POA: Diagnosis not present

## 2024-05-26 DIAGNOSIS — R6889 Other general symptoms and signs: Secondary | ICD-10-CM | POA: Diagnosis not present

## 2024-05-26 DIAGNOSIS — M75111 Incomplete rotator cuff tear or rupture of right shoulder, not specified as traumatic: Secondary | ICD-10-CM | POA: Diagnosis not present

## 2024-06-09 DIAGNOSIS — M75111 Incomplete rotator cuff tear or rupture of right shoulder, not specified as traumatic: Secondary | ICD-10-CM | POA: Diagnosis not present

## 2024-06-09 DIAGNOSIS — R6889 Other general symptoms and signs: Secondary | ICD-10-CM | POA: Diagnosis not present

## 2024-06-20 DIAGNOSIS — M75111 Incomplete rotator cuff tear or rupture of right shoulder, not specified as traumatic: Secondary | ICD-10-CM | POA: Diagnosis not present

## 2024-06-20 DIAGNOSIS — R6889 Other general symptoms and signs: Secondary | ICD-10-CM | POA: Diagnosis not present

## 2024-06-28 DIAGNOSIS — M75111 Incomplete rotator cuff tear or rupture of right shoulder, not specified as traumatic: Secondary | ICD-10-CM | POA: Diagnosis not present

## 2024-06-28 DIAGNOSIS — R6889 Other general symptoms and signs: Secondary | ICD-10-CM | POA: Diagnosis not present

## 2024-07-19 ENCOUNTER — Other Ambulatory Visit: Payer: Self-pay | Admitting: Family Medicine

## 2024-08-11 DIAGNOSIS — N951 Menopausal and female climacteric states: Secondary | ICD-10-CM | POA: Diagnosis not present

## 2024-08-25 ENCOUNTER — Encounter: Payer: Self-pay | Admitting: Family Medicine

## 2024-08-25 ENCOUNTER — Other Ambulatory Visit: Payer: Self-pay | Admitting: *Deleted

## 2024-08-25 NOTE — Telephone Encounter (Signed)
 I called pt & LMTCB to make an appt w/[facility] for med refill. Also, I sent pt a letter about this!

## 2024-08-25 NOTE — Telephone Encounter (Signed)
 [facility] pt NTBS 30-d given 07/19/24

## 2024-08-28 ENCOUNTER — Other Ambulatory Visit: Payer: Self-pay | Admitting: *Deleted

## 2024-09-01 DIAGNOSIS — Z4889 Encounter for other specified surgical aftercare: Secondary | ICD-10-CM | POA: Diagnosis not present

## 2024-09-27 DIAGNOSIS — N951 Menopausal and female climacteric states: Secondary | ICD-10-CM | POA: Diagnosis not present

## 2024-10-06 ENCOUNTER — Ambulatory Visit (INDEPENDENT_AMBULATORY_CARE_PROVIDER_SITE_OTHER): Admitting: Nurse Practitioner

## 2024-10-06 ENCOUNTER — Encounter: Payer: Self-pay | Admitting: Nurse Practitioner

## 2024-10-06 VITALS — BP 145/79 | HR 98 | Temp 97.3°F | Ht 64.0 in | Wt 136.0 lb

## 2024-10-06 DIAGNOSIS — J40 Bronchitis, not specified as acute or chronic: Secondary | ICD-10-CM | POA: Diagnosis not present

## 2024-10-06 MED ORDER — PREDNISONE 20 MG PO TABS: 40.0000 mg | ORAL_TABLET | Freq: Every day | ORAL | 0 refills | Status: AC

## 2024-10-06 MED ORDER — AZITHROMYCIN 250 MG PO TABS: ORAL_TABLET | ORAL | 0 refills | Status: DC

## 2024-10-06 NOTE — Progress Notes (Signed)
 "  Subjective:    Patient ID: Lynn Fernandez, female    DOB: 1966/09/24, 58 y.o.   MRN: [id number]   Chief Complaint: cough  Cough This is a new problem. The current episode started more than 1 month ago. The problem has been waxing and waning. The problem occurs every few minutes. The cough is Productive of sputum. Associated symptoms include nasal congestion and rhinorrhea. Pertinent negatives include no chills, ear congestion, fever, sore throat or shortness of breath. She has tried OTC cough suppressant for the symptoms. The treatment provided mild relief.    Patient Active Problem List   Diagnosis Date Noted   Need for shingles vaccine 12/09/2023   Everitt Curt disease (tenosynovitis) 10/14/2022   Gastric wall thickening 01/20/2018   Constipation 11/30/2017   Special screening for malignant neoplasms, colon 11/30/2017   GERD (gastroesophageal reflux disease) 03/31/2012   Allergic rhinitis, seasonal 03/31/2012   Hyperlipidemia with target LDL less than 100 03/31/2012       Review of Systems  Constitutional:  Negative for chills and fever.  HENT:  Positive for congestion and rhinorrhea. Negative for sore throat.   Respiratory:  Positive for cough. Negative for shortness of breath.        Objective:   Physical Exam Constitutional:      Appearance: Normal appearance.  HENT:     Right Ear: Tympanic membrane normal.     Left Ear: Tympanic membrane normal.     Nose: Congestion and rhinorrhea present.  Eyes:     Pupils: Pupils are equal, round, and reactive to light.  Cardiovascular:     Rate and Rhythm: Normal rate and regular rhythm.     Heart sounds: Normal heart sounds.  Pulmonary:     Effort: Pulmonary effort is normal.     Breath sounds: Normal breath sounds.     Comments: Breath  sounds Skin:    General: Skin is warm.  Neurological:     General: No focal deficit present.     Mental Status: She is alert and oriented to person, place, and time.   Psychiatric:        Mood and Affect: Mood normal.        Behavior: Behavior normal.    BP (!) 145/79   Pulse 98   Temp (!) 97.3 F (36.3 C) (Temporal)   Ht 5' 4 (1.626 m)   Wt 136 lb (61.7 kg)   SpO2 96%   BMI 23.34 kg/m         Assessment & Plan:  Lynn Fernandez in today with chief complaint of No chief complaint on file.   1. Bronchitis (Primary) 1. Take meds as prescribed 2. Use a cool mist humidifier especially during the winter months and when heat has been humid. 3. Use saline nose sprays frequently 4. Saline irrigations of the nose can be very helpful if done frequently.  * 4X daily for 1 week*  * Use of a nettie pot can be helpful with this. Follow directions with this* 5. Drink plenty of fluids 6. Keep thermostat turn down low 7.For any cough or congestion- mucinex 8. For fever or aces or pains- take tylenol  or ibuprofen appropriate for age and weight.  * for fevers greater than 101 orally you may alternate ibuprofen and tylenol  every  3 hours.    - azithromycin  (ZITHROMAX  Z-PAK) 250 MG tablet; As directed  Dispense: 6 tablet; Refill: 0 - predniSONE  (DELTASONE ) 20 MG tablet; Take 2 tablets (40 mg  total) by mouth daily with breakfast for 5 days. 2 po daily for 5 days  Dispense: 10 tablet; Refill: 0    The above assessment and management plan was discussed with the patient. The patient verbalized understanding of and has agreed to the management plan. Patient is aware to call the clinic if symptoms persist or worsen. Patient is aware when to return to the clinic for a follow-up visit. Patient educated on when it is appropriate to go to the emergency department.   Mary-Margaret Gladis, FNP    "

## 2024-10-06 NOTE — Patient Instructions (Signed)

## 2024-10-09 ENCOUNTER — Other Ambulatory Visit: Payer: Self-pay

## 2024-10-09 ENCOUNTER — Telehealth: Payer: Self-pay | Admitting: Family Medicine

## 2024-10-09 MED ORDER — ROSUVASTATIN CALCIUM 10 MG PO TABS: 10.0000 mg | ORAL_TABLET | Freq: Every day | ORAL | 0 refills | Status: DC

## 2024-10-09 NOTE — Telephone Encounter (Signed)
 One month supply refilled until apt.

## 2024-10-09 NOTE — Telephone Encounter (Signed)
" °  Prescription Request  10/09/2024  Is this a Controlled Substance medicine? NO  Have you seen your PCP in the last 2 weeks? NO  If YES, route message to pool  -  If NO, patient needs to be scheduled for appointment.  -  PATIENT SCHEDULED FOR 11/09/2024 (first available)  What is the name of the medication or equipment?   rosuvastatin  (CRESTOR ) 10 MG tablet 10 mg, Daily    Have you contacted your pharmacy to request a refill? YES will not refill without provider approval since patient needs to be seen first.  Patient will run out of meds before appointment.     Which pharmacy would you like this sent to? [facility] (must be 90 days because of insurance not paying unless it is)   Patient notified that their request is being sent to the clinical staff for review and that they should receive a response within 2 business days.    "

## 2024-10-31 ENCOUNTER — Ambulatory Visit

## 2024-11-09 ENCOUNTER — Ambulatory Visit (INDEPENDENT_AMBULATORY_CARE_PROVIDER_SITE_OTHER): Admitting: Family Medicine

## 2024-11-09 ENCOUNTER — Other Ambulatory Visit: Payer: Self-pay | Admitting: Family Medicine

## 2024-11-09 ENCOUNTER — Encounter: Payer: Self-pay | Admitting: Family Medicine

## 2024-11-09 VITALS — BP 137/85 | HR 83 | Temp 97.9°F | Ht 64.0 in | Wt 142.0 lb

## 2024-11-09 DIAGNOSIS — N951 Menopausal and female climacteric states: Secondary | ICD-10-CM | POA: Diagnosis not present

## 2024-11-09 DIAGNOSIS — E785 Hyperlipidemia, unspecified: Secondary | ICD-10-CM | POA: Diagnosis not present

## 2024-11-09 DIAGNOSIS — M25511 Pain in right shoulder: Secondary | ICD-10-CM

## 2024-11-09 DIAGNOSIS — R030 Elevated blood-pressure reading, without diagnosis of hypertension: Secondary | ICD-10-CM | POA: Diagnosis not present

## 2024-11-09 DIAGNOSIS — K219 Gastro-esophageal reflux disease without esophagitis: Secondary | ICD-10-CM | POA: Diagnosis not present

## 2024-11-09 MED ORDER — ROSUVASTATIN CALCIUM 10 MG PO TABS: 10.0000 mg | ORAL_TABLET | Freq: Every day | ORAL | 0 refills | Status: DC

## 2024-11-09 MED ORDER — ROSUVASTATIN CALCIUM 10 MG PO TABS: 10.0000 mg | ORAL_TABLET | Freq: Every day | ORAL | 3 refills | Status: AC

## 2024-11-09 MED ORDER — RABEPRAZOLE SODIUM 20 MG PO TBEC: 20.0000 mg | DELAYED_RELEASE_TABLET | Freq: Every day | ORAL | 3 refills | Status: AC

## 2024-11-09 NOTE — Patient Instructions (Signed)

## 2024-11-09 NOTE — Progress Notes (Signed)
 "  Subjective:  Patient ID: Lynn Fernandez, female    DOB: [birth year]  Age: 59 y.o. MRN: [id number]  CC: Medication Refill (Pended/Has to be 90 day script.) and Hypertension   HPI  Discussed the use of AI scribe software for clinical note transcription with the patient, who gave verbal consent to proceed.  History of Present Illness Lynn Fernandez is a 59 year old female with hypertension who presents for follow-up of hypertension and weight management.  She has been experiencing borderline high blood pressure, which was elevated during her last visit the day after Christmas. She attributes some of the increase to stress, as her husband recently retired, and she is currently without replacement insurance. Her blood pressure was also high during a visit to the OB GYN the week before Christmas when she started hormone replacement therapy.  She has been experiencing weight gain, which she attributes to decreased activity following rotator cuff surgery last May. Her weight is higher now than during her last visit. She has been more sedentary due to her shoulder recovery and increased work stress. She mentions being dehydrated and is trying to drink more water. She previously lost 20-30 pounds in a year by changing her weight management approach but has struggled to maintain it due to her recent lifestyle changes.  She is currently on rosuvastatin , which she tolerates well, and rabeprazole  for reflux. She also takes fish oil supplements. She cannot tolerate red wine and prefers tequila or bourbon. She tries to keep her alcohol consumption under control.  She had a fall last year resulting in rotator cuff surgery, which has impacted her physical activity. 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  She reports a persistent cough following a bronchitis episode after Christmas, which has mostly resolved but  occasionally recurs. She reports a persistent cough following a bronchitis episode after Christmas, which has mostly resolved but occasionally recurs and is annoying.  She is currently on hormone replacement therapy and has noticed some weight gain, which she attributes partly to the estrogen.          11/09/2024   10:13 AM 01/11/2024   10:31 AM 11/22/2023    4:13 PM  Depression screen PHQ 2/9  Decreased Interest 0 0 0  Down, Depressed, Hopeless 0 0 0  PHQ - 2 Score 0 0 0  Altered sleeping 0 0   Tired, decreased energy 1 2   Change in appetite 0 0   Feeling bad or failure about yourself  0 0   Trouble concentrating 0 0   Moving slowly or fidgety/restless 0 0   Suicidal thoughts 0 0   PHQ-9 Score 1 2    Difficult doing work/chores Not difficult at all Not difficult at all      Data saved with a previous flowsheet row definition    History Lynn Fernandez has a past medical history of Allergic rhinitis, Dyslipidemia, GERD (gastroesophageal reflux disease), and Hyperlipidemia.   She has a past surgical history that includes pyloric stenosis; Esophagogastroduodenoscopy (10/05); Esophagogastroduodenoscopy (egd) with propofol  (N/A, 02/11/2018); Colonoscopy with propofol  (N/A, 02/11/2018); and biopsy (02/11/2018).   Her family history includes Alcohol abuse in her father; Breast cancer in her maternal grandmother; Cancer in her paternal grandmother; Diabetes in her paternal uncle; Heart disease in her father, maternal grandfather, and paternal uncle; Hyperlipidemia in her father; Prostate cancer in her father.She reports that she  has never smoked. She has never used smokeless tobacco. She reports current alcohol use of about 3.0 standard drinks of alcohol per week. She reports that she does not use drugs.    ROS Review of Systems  Constitutional:  Positive for unexpected weight change.  HENT: Negative.    Eyes:  Negative for visual disturbance.  Respiratory:  Positive for cough. Negative for  shortness of breath.   Cardiovascular:  Negative for chest pain.  Gastrointestinal:  Negative for abdominal pain.  Musculoskeletal:  Negative for arthralgias.    Objective:  BP 137/85   Pulse 83   Temp 97.9 F (36.6 C)   Ht 5' 4 (1.626 m)   Wt 142 lb (64.4 kg)   SpO2 97%   BMI 24.37 kg/m   BP Readings from Last 3 Encounters:  11/09/24 137/85  10/06/24 (!) 145/79  01/11/24 123/73    Wt Readings from Last 3 Encounters:  11/09/24 142 lb (64.4 kg)  10/06/24 136 lb (61.7 kg)  01/11/24 124 lb (56.2 kg)     Physical Exam Physical Exam GENERAL: Alert, cooperative, well developed, no acute distress. HEENT: Normocephalic, normal oropharynx, moist mucous membranes. CHEST: Clear to auscultation bilaterally, no wheezes, rhonchi, or crackles. CARDIOVASCULAR: Normal heart rate and rhythm, S1 and S2 normal without murmurs. ABDOMEN: Soft, non-tender, non-distended, without organomegaly, normal bowel sounds. EXTREMITIES: No cyanosis or edema. NEUROLOGICAL: Cranial nerves grossly intact, moves all extremities without gross motor or sensory deficit.   Assessment & Plan:  Hyperlipidemia with target LDL less than 100 -     CBC with Differential/Platelet -     CMP14+EGFR -     Lipid panel  Gastroesophageal reflux disease without esophagitis -     CBC with Differential/Platelet -     CMP14+EGFR -     Lipid panel  Acute pain of right shoulder  Elevated blood pressure reading  Menopause syndrome  Other orders -     RABEprazole  Sodium; Take 1 tablet (20 mg total) by mouth daily.  Dispense: 90 tablet; Refill: 3 -     Rosuvastatin  Calcium ; Take 1 tablet (10 mg total) by mouth daily.  Dispense: 90 tablet; Refill: 3    Assessment and Plan Assessment & Plan Hyperlipidemia   Her hyperlipidemia is well-managed with rosuvastatin , with no reported side effects. Continue rosuvastatin  as prescribed.  Gastroesophageal reflux disease   Her gastroesophageal reflux disease is managed  with rabeprazole , with no new symptoms reported. Continue rabeprazole  as prescribed.  Elevated blood pressure   Blood pressure is borderline elevated, likely due to stress and recent life changes. No immediate need for antihypertensive medication. Provided dietary tips, including the DASH program, to manage blood pressure without medication. Monitor blood pressure at home during low-stress times. Follow up in six months unless blood pressure remains consistently over 130/80 mmHg.  Overweight and weight management   Weight gain is attributed to stress, hormonal changes, and decreased activity post-surgery. She prefers lifestyle modifications over medication. Encouraged increased physical activity and dietary modifications to manage weight. Discussed potential impact of estrogen on weight gain.  Post-infectious cough   Intermittent cough is likely due to post-bronchitic cough, not causing significant pathology. Will consider benzonatate  if cough becomes more bothersome.  Right shoulder pain, status post rotator cuff surgery   Right shoulder pain persists post-surgery, with limited range of motion. Continue shoulder stretching exercises to improve range of motion.       Follow-up: Return in about 6 months (around 05/09/2025) for [facility] physical.  Butler Der,  M.D. "

## 2024-11-12 ENCOUNTER — Encounter: Payer: Self-pay | Admitting: Family Medicine

## 2024-11-13 ENCOUNTER — Other Ambulatory Visit

## 2024-11-16 ENCOUNTER — Other Ambulatory Visit

## 2024-11-16 ENCOUNTER — Telehealth: Payer: Self-pay | Admitting: Family Medicine

## 2024-11-16 LAB — LIPID PANEL
Chol/HDL Ratio: 2 ratio (ref 0.0–4.4)
Cholesterol, Total: 213 mg/dL — ABNORMAL HIGH (ref 100–199)
HDL: 105 mg/dL
LDL Chol Calc (NIH): 98 mg/dL (ref 0–99)
Triglycerides: 56 mg/dL (ref 0–149)
VLDL Cholesterol Cal: 10 mg/dL (ref 5–40)

## 2024-11-16 LAB — CBC WITH DIFFERENTIAL/PLATELET
Basophils Absolute: 0 10*3/uL (ref 0.0–0.2)
Basos: 1 %
EOS (ABSOLUTE): 0.1 10*3/uL (ref 0.0–0.4)
Eos: 2 %
Hematocrit: 40.9 % (ref 34.0–46.6)
Hemoglobin: 13.7 g/dL (ref 11.1–15.9)
Immature Grans (Abs): 0 10*3/uL (ref 0.0–0.1)
Immature Granulocytes: 0 %
Lymphocytes Absolute: 2 10*3/uL (ref 0.7–3.1)
Lymphs: 29 %
MCH: 31.4 pg (ref 26.6–33.0)
MCHC: 33.5 g/dL (ref 31.5–35.7)
MCV: 94 fL (ref 79–97)
Monocytes Absolute: 0.5 10*3/uL (ref 0.1–0.9)
Monocytes: 7 %
Neutrophils Absolute: 4.1 10*3/uL (ref 1.4–7.0)
Neutrophils: 61 %
Platelets: 273 10*3/uL (ref 150–450)
RBC: 4.37 x10E6/uL (ref 3.77–5.28)
RDW: 12.5 % (ref 11.7–15.4)
WBC: 6.8 10*3/uL (ref 3.4–10.8)

## 2024-11-16 LAB — CMP14+EGFR
ALT: 18 [IU]/L (ref 0–32)
AST: 23 [IU]/L (ref 0–40)
Albumin: 4.4 g/dL (ref 3.8–4.9)
Alkaline Phosphatase: 29 [IU]/L — ABNORMAL LOW (ref 49–135)
BUN/Creatinine Ratio: 19 (ref 9–23)
BUN: 15 mg/dL (ref 6–24)
Bilirubin Total: 0.9 mg/dL (ref 0.0–1.2)
CO2: 24 mmol/L (ref 20–29)
Calcium: 9.3 mg/dL (ref 8.7–10.2)
Chloride: 103 mmol/L (ref 96–106)
Creatinine, Ser: 0.78 mg/dL (ref 0.57–1.00)
Globulin, Total: 2.1 g/dL (ref 1.5–4.5)
Glucose: 78 mg/dL (ref 70–99)
Potassium: 4.5 mmol/L (ref 3.5–5.2)
Sodium: 139 mmol/L (ref 134–144)
Total Protein: 6.5 g/dL (ref 6.0–8.5)
eGFR: 89 mL/min/{1.73_m2}

## 2024-11-16 NOTE — Telephone Encounter (Signed)
 Medications filled 11-09-24 was sent to [facility] and patient told Dr. Zollie and nurse at appt to send to [facility]. Patient is going to keep these two RX's at [facility] but going forward to [facility].

## 2024-11-16 NOTE — Telephone Encounter (Signed)
 [facility] is in patient pharmacy contact.

## 2025-05-09 ENCOUNTER — Encounter: Admitting: Family Medicine
